# Patient Record
Sex: Female | Born: 1990 | Hispanic: Yes | Marital: Single | Smoking: Never smoker
Health system: Southern US, Community
[De-identification: ages and names within clinical notes are randomized; demographics above are authoritative.]

## PROBLEM LIST (undated history)

## (undated) ENCOUNTER — Inpatient Hospital Stay (HOSPITAL_COMMUNITY): Payer: Self-pay

## (undated) DIAGNOSIS — Z789 Other specified health status: Secondary | ICD-10-CM

---

## 2009-09-30 ENCOUNTER — Inpatient Hospital Stay (HOSPITAL_COMMUNITY): Admission: AD | Admit: 2009-09-30 | Discharge: 2009-10-04 | Payer: Self-pay | Admitting: Obstetrics

## 2009-10-08 ENCOUNTER — Ambulatory Visit: Payer: Self-pay | Admitting: Obstetrics and Gynecology

## 2009-10-08 ENCOUNTER — Inpatient Hospital Stay (HOSPITAL_COMMUNITY): Admission: AD | Admit: 2009-10-08 | Discharge: 2009-10-08 | Payer: Self-pay | Admitting: Obstetrics

## 2010-04-11 LAB — CBC
HCT: 40.1 % (ref 36.0–46.0)
Hemoglobin: 10.9 g/dL — ABNORMAL LOW (ref 12.0–15.0)
Hemoglobin: 13.7 g/dL (ref 12.0–15.0)
MCH: 33.5 pg (ref 26.0–34.0)
MCV: 95.4 fL (ref 78.0–100.0)
MCV: 96.5 fL (ref 78.0–100.0)
Platelets: 195 10*3/uL (ref 150–400)
RBC: 3.24 MIL/uL — ABNORMAL LOW (ref 3.87–5.11)
RBC: 4.2 MIL/uL (ref 3.87–5.11)
WBC: 13.9 10*3/uL — ABNORMAL HIGH (ref 4.0–10.5)
WBC: 9.6 10*3/uL (ref 4.0–10.5)

## 2013-01-13 ENCOUNTER — Inpatient Hospital Stay (HOSPITAL_COMMUNITY): Payer: Self-pay

## 2013-01-13 ENCOUNTER — Encounter (HOSPITAL_COMMUNITY): Payer: Self-pay | Admitting: General Practice

## 2013-01-13 ENCOUNTER — Inpatient Hospital Stay (HOSPITAL_COMMUNITY)
Admission: AD | Admit: 2013-01-13 | Discharge: 2013-01-13 | Disposition: A | Discharge status: Home / Self Care | Payer: Self-pay | Source: Ambulatory Visit | Attending: Obstetrics & Gynecology | Admitting: Obstetrics & Gynecology

## 2013-01-13 DIAGNOSIS — O26859 Spotting complicating pregnancy, unspecified trimester: Secondary | ICD-10-CM | POA: Insufficient documentation

## 2013-01-13 DIAGNOSIS — O209 Hemorrhage in early pregnancy, unspecified: Secondary | ICD-10-CM

## 2013-01-13 LAB — WET PREP, GENITAL: Clue Cells Wet Prep HPF POC: NONE SEEN

## 2013-01-13 LAB — CBC
HCT: 38.3 % (ref 36.0–46.0)
Hemoglobin: 13.5 g/dL (ref 12.0–15.0)
MCV: 87.6 fL (ref 78.0–100.0)
WBC: 9.4 10*3/uL (ref 4.0–10.5)

## 2013-01-13 LAB — URINALYSIS, ROUTINE W REFLEX MICROSCOPIC
Bilirubin Urine: NEGATIVE
Glucose, UA: NEGATIVE mg/dL
Ketones, ur: NEGATIVE mg/dL
Leukocytes, UA: NEGATIVE
Nitrite: NEGATIVE
Specific Gravity, Urine: 1.025 (ref 1.005–1.030)
pH: 6 (ref 5.0–8.0)

## 2013-01-13 LAB — URINE MICROSCOPIC-ADD ON

## 2013-01-13 LAB — HCG, QUANTITATIVE, PREGNANCY: hCG, Beta Chain, Quant, S: 428 m[IU]/mL — ABNORMAL HIGH (ref <5)

## 2013-01-13 NOTE — MAU Note (Signed)
Pt states she had a positive pregnancy test at home a week and a half ago a two more this am all of which are positive but pt states she's had some bleeding today like a period. Pt has no pain but states pressure in left lower quadrant.

## 2013-01-13 NOTE — MAU Note (Signed)
4 +HPT- first was 10 days ago. Spotting started yesterday, more like a period today.

## 2013-01-14 LAB — GC/CHLAMYDIA PROBE AMP: GC Probe RNA: NEGATIVE

## 2013-01-15 ENCOUNTER — Inpatient Hospital Stay (HOSPITAL_COMMUNITY)
Admission: AD | Admit: 2013-01-15 | Discharge: 2013-01-15 | Disposition: A | Discharge status: Home / Self Care | Payer: Self-pay | Source: Ambulatory Visit | Attending: Family Medicine | Admitting: Family Medicine

## 2013-01-15 DIAGNOSIS — O200 Threatened abortion: Secondary | ICD-10-CM | POA: Insufficient documentation

## 2013-01-15 DIAGNOSIS — O209 Hemorrhage in early pregnancy, unspecified: Secondary | ICD-10-CM

## 2013-01-15 LAB — HCG, QUANTITATIVE, PREGNANCY: hCG, Beta Chain, Quant, S: 391 m[IU]/mL — ABNORMAL HIGH (ref <5)

## 2013-01-15 NOTE — MAU Note (Signed)
Pt states via Maday, spanish translator that she is having increased bleeding.since being seen two days ago. No pain except when standing for prolonged time period. Changed pad twice yesterday. Has seen small dime sized clots.

## 2013-01-15 NOTE — MAU Provider Note (Signed)
HPI:  [name] is a 22 y.o. female G2P1001 at [redacted]w[redacted]d who presents for a follow up beta hcg level. She was here 12/18 for vaginal bleeding in pregnancy. Quant on 12/18: 428, Quant today 391. Pt is very tearful about the lab results. She currently denies pain and says her bleeding has decreased.     Objective:  GENERAL: Well-developed, well-nourished female in no acute distress.  HEENT: Normocephalic, atraumatic.   LUNGS: Effort normal HEART: Regular rate  SKIN: Warm, dry and without erythema PSYCH: Normal mood and affect  Filed Vitals:   01/15/13 0956  BP: 113/68  Pulse: 92  Temp: 98.9 F (37.2 C)  TempSrc: Oral  Resp: 18  Height: 5\' 2"  (1.575 m)  Weight: 60.385 kg (133 lb 2 oz)   MDM: Beta Hcg level Pt would like to to wait 48 hours and have another Quant drawn before making any decisions regarding medical management.   A: 1. Threatened miscarriage in early pregnancy   2. Vaginal bleeding in pregnancy, first trimester   3.  Decrease in Beta Hcg level  P: Discharge home is stable condtion Return in 48 hours for beta hcg level Ectopic precautions discussed at length Bleeding precautions discussed Return to MAU as needed, if symptoms worsen   Iona Hansen Rasch, NP 01/15/2013 10:39 AM

## 2013-01-15 NOTE — MAU Provider Note (Signed)
Chart reviewed and agree with management and plan.  

## 2013-01-17 ENCOUNTER — Inpatient Hospital Stay (HOSPITAL_COMMUNITY)
Admission: AD | Admit: 2013-01-17 | Discharge: 2013-01-17 | Disposition: A | Discharge status: Home / Self Care | Payer: Self-pay | Source: Ambulatory Visit | Attending: Obstetrics & Gynecology | Admitting: Obstetrics & Gynecology

## 2013-01-17 ENCOUNTER — Encounter (HOSPITAL_COMMUNITY): Payer: Self-pay

## 2013-01-17 DIAGNOSIS — O039 Complete or unspecified spontaneous abortion without complication: Secondary | ICD-10-CM | POA: Insufficient documentation

## 2013-01-17 LAB — HCG, QUANTITATIVE, PREGNANCY: hCG, Beta Chain, Quant, S: 256 m[IU]/mL — ABNORMAL HIGH (ref <5)

## 2013-01-17 NOTE — MAU Note (Signed)
Patient to MAU for a repeat BHCG. Denies pain but has bleeding less than a period.

## 2013-01-17 NOTE — MAU Provider Note (Signed)
Kathleen Estrada is a 22 y.o. G2P1001 at [redacted]w[redacted]d who presents to MAU today for follow-up quant hCG. The patient was seen for follow-up on 01/15/13 and quant hCG at that time was dropping. The patient was very tearful and desired the pregnancy, so it was agreed that we would have her return again in 48 hours for another quant hCG to confirm. The patient states that she has continued to have some light bleeding and mild lower abdominal pain.   BP 117/71  Pulse 97  Temp(Src) 99.1 F (37.3 C) (Oral)  Resp 16  SpO2 100%  LMP 12/06/2012  Breastfeeding? Unknown GENERAL: Well-developed, well-nourished female in no acute distress.  HEENT: Normocephalic, atraumatic.   LUNGS: Effort normal HEART: Regular rate  SKIN: Warm, dry and without erythema PSYCH: Normal mood and affect  Results for [name], [name] (MRN [id number]) as of 01/17/2013 16:36  Ref. Range 01/13/2013 12:13 01/13/2013 14:16 01/13/2013 14:23 01/15/2013 09:47 01/17/2013 09:45  hCG, Beta Chain, Quant, S Latest Range: <5 mIU/mL 428 (H)   391 (H) 256 (H)    A: SAB  P: Discharge home Bleeding precautions discussed Patient referred to [facility] clinic for follow-up in ~ 2 weeks Patient may return to MAU as needed or if her condition were to change or worsen  Freddi Starr, PA-C 01/17/2013 4:38 PM

## 2013-01-31 ENCOUNTER — Encounter: Payer: Self-pay | Admitting: Nurse Practitioner

## 2013-01-31 ENCOUNTER — Ambulatory Visit (INDEPENDENT_AMBULATORY_CARE_PROVIDER_SITE_OTHER): Payer: Self-pay | Admitting: Obstetrics & Gynecology

## 2013-01-31 VITALS — BP 112/72 | HR 90 | Ht 62.6 in | Wt 133.5 lb

## 2013-01-31 DIAGNOSIS — Z309 Encounter for contraceptive management, unspecified: Secondary | ICD-10-CM

## 2013-01-31 DIAGNOSIS — O039 Complete or unspecified spontaneous abortion without complication: Secondary | ICD-10-CM

## 2013-01-31 NOTE — Progress Notes (Signed)
Has some pelvic pain when standing. No more bleeding.

## 2013-01-31 NOTE — Patient Instructions (Signed)
Aborto espontáneo  °(Miscarriage) °El aborto espontáneo es la pérdida de un bebé que no ha nacido (feto) antes de la semana 20 del embarazo. La mayor parte de estos abortos ocurre en los primeros 3 meses. En algunos casos ocurre antes de que la mujer sepa que está embarazada. También se denomina "aborto espontáneo" o "pérdida prematura del embarazo". El aborto espontáneo puede ser una experiencia que afecte emocionalmente a la persona. Converse con su médico si tiene dudas, cómo es el proceso de duelo, y sobre planes futuros de embarazo.  °CAUSAS  °· Algunos problemas cromosómicos pueden hacer imposible que el bebé se desarrolle normalmente. Los problemas con los genes o cromosomas del bebé son generalmente el resultado de errores que se producen, por casualidad, cuando el embrión se divide y crece. Estos problemas no se heredan de los padres. °· Infección en el cuello del útero.   °· Problemas hormonales.   °· Problemas en el cuello del útero, como tener un útero incompetente. Esto ocurre cuando los tejidos no son lo suficientemente fuertes como para contener el embarazo.   °· Problemas del útero, como un útero con forma anormal, los fibromas o anormalidades congénitas.   °· Ciertas enfermedades crónicas.   °· No fume, no beba alcohol, ni consuma drogas.   °· Traumatismos   °A veces, la causa es desconocida.  °SÍNTOMAS  °· Sangrado o manchado vaginal, con o sin cólicos o dolor. °· Dolor o cólicos en el abdomen o en la cintura. °· Eliminación de líquido, tejidos o coágulos grandes por la vagina. °DIAGNÓSTICO  °El médico le hará un examen físico. También le indicará una ecografía para confirmar el aborto. Es posible que se realicen análisis de sangre.  °TRATAMIENTO  °· En algunos casos el tratamiento no es necesario, si se eliminan naturalmente todos los tejidos embrionarios que se encontraban en el útero. Si el feto o la placenta quedan dentro del útero (aborto incompleto), pueden infectarse, los tejidos que quedan  pueden infectarse y deben retirarse. Generalmente se realiza un procedimiento de dilatación y curetaje (D y C). Durante el procedimiento de dilatación y curetaje, el cuello del útero se abre (dilata) y se retira cualquier resto de tejido fetal o placentario del útero. °· Si hay una infección, le recetarán antibióticos. Podrán recetarle otros medicamentos para reducir el tamaño del útero (contraerlo) si hay una mucho sangrado. °· Si su sangre es Rh negativa y su bebé es Rh positivo, usted necesitará la inyección de inmunoglobulina Rh. Esta inyección protegerá a los futuros bebés de tener problemas de compatibilidad Rh en futuros embarazos. °INSTRUCCIONES PARA EL CUIDADO EN EL HOGAR  °· El médico le indicará reposo en cama o le permitirá realizar actividades livianas. Vuelva a la actividad lentamente o según las indicaciones de su médico. °· Pídale a alguien que la ayude con las responsabilidades familiares y del hogar durante este tiempo.   °· Lleve un registro de la cantidad y la saturación de las toallas higiénicas que utiliza cada día. Anote esta información   °· No use tampones. No No se haga duchas vaginales ni tenga relaciones sexuales hasta que el médico la autorice.   °· Sólo tome medicamentos de venta libre o recetados para calmar el dolor o el malestar, según las indicaciones de su médico.   °· No tome aspirina. La aspirina puede ocasionar hemorragias.   °· Concurra puntualmente a las citas de control con el médico.   °· Si usted o su pareja tienen dificultades con el duelo, hable con su médico para buscar la ayuda psicológica que los ayude a enfrentar la pérdida   del embarazo. Permítase el tiempo suficiente de duelo antes de quedar embarazada nuevamente.   °SOLICITE ATENCIÓN MÉDICA DE INMEDIATO SI:  °· Siente calambres intensos o dolor en la espalda o en el abdomen. °· Tiene fiebre. °· Elimina grandes coágulos de sangre (del tamaño de una nuez o más) o tejidos por la vagina. Guarde lo que ha eliminado para  que su médico lo examine.   °· La hemorragia aumenta.   °· Observa una secreción vaginal espesa y con mal olor. °· Se siente mareada, débil, o se desmaya.   °· Siente escalofríos.   °ASEGÚRESE DE QUE:  °· Comprende estas instrucciones. °· Controlará su enfermedad. °· Solicitará ayuda de inmediato si no mejora o si empeora. °Document Released: 10/23/2004 Document Revised: 05/10/2012 °ExitCare® Patient Information ©2014 [employer]. ° °

## 2013-02-02 NOTE — Progress Notes (Signed)
Subjective:     Patient ID: Kathleen Estrada, female   DOB: [birth year], 23 y.o.   MRN: [id number]  HPI Pt s/p SAB.  She had documented falling bHCG levels.  She denies current bleeding or problems.  Th epregnancy was planned but, she is now interested in waiting for 1 years.  She was previously on OCP's from the HD and she wants to continue those.   Review of Systems     Objective:   Physical Exam BP 112/72  Pulse 90  Ht 5' 2.6" (1.59 m)  Wt 133 lb 8 oz (60.555 kg)  BMI 23.95 kg/m2  LMP 12/06/2012 Abd: soft NT, ND     Assessment:     S/p SAB doing well Contraception counseling     Plan:     Cont OCP (pt thinks that it is [facility])  F/u 1 year or sooner prn

## 2013-11-28 ENCOUNTER — Encounter: Payer: Self-pay | Admitting: Nurse Practitioner

## 2014-08-31 ENCOUNTER — Other Ambulatory Visit: Payer: Self-pay | Admitting: Podiatry

## 2014-08-31 MED ORDER — CICLOPIROX 8 % EX SOLN: Freq: Every day | CUTANEOUS | Status: DC

## 2015-07-17 ENCOUNTER — Ambulatory Visit (INDEPENDENT_AMBULATORY_CARE_PROVIDER_SITE_OTHER): Payer: Self-pay

## 2015-07-17 ENCOUNTER — Ambulatory Visit (INDEPENDENT_AMBULATORY_CARE_PROVIDER_SITE_OTHER): Payer: Self-pay | Admitting: Internal Medicine

## 2015-07-17 VITALS — BP 122/80 | HR 73 | Temp 99.3°F | Resp 16 | Ht 63.0 in | Wt 134.6 lb

## 2015-07-17 DIAGNOSIS — S52121A Displaced fracture of head of right radius, initial encounter for closed fracture: Secondary | ICD-10-CM

## 2015-07-17 DIAGNOSIS — M25521 Pain in right elbow: Secondary | ICD-10-CM

## 2015-07-17 NOTE — Progress Notes (Signed)
Subjective:  By signing my name below, I, Raven Small, attest that this documentation has been prepared under the direction and in the presence of Ellamae Siaobert Willet Schleifer, MD.  Electronically Signed: Andrew Auaven Small, ED Scribe. 07/17/2015. 12:45 PM.   Patient ID: Kathleen Estrada, female    DOB: Jul 19, 1990, 25 y.o.   MRN: [id number]  HPI Chief Complaint  Patient presents with  . Fall  . Arm Injury    states she fell on yesterday and hurt her right arm;   . Hand Pain    right hand pain from fall    HPI Comments: Kathleen Estrada is a 25 y.o. female who presents to the [facility] complaining of a right arm injury that occurred yesterday. Pt FOOSH and injured right arm. She has pain with movement and straighten of right arm and elbow. Pt denies chance of pregnancy.   Fell over "bumper" in parking lot at [employer]   There are no active problems to display for this patient.  History reviewed. No pertinent past medical history. Past Surgical History  Procedure Laterality Date  . Cesarean section  2011   No Known Allergies Prior to Admission medications   Not on File   Social History   Social History  . Marital Status: Single    Spouse Name: N/A  . Number of Children: N/A  . Years of Education: N/A   Occupational History  . Not on file.   Social History Main Topics  . Smoking status: Never Smoker   . Smokeless tobacco: Not on file  . Alcohol Use: No  . Drug Use: No  . Sexual Activity: Not on file   Other Topics Concern  . Not on file   Social History Narrative   Review of Systems     Objective:   Physical Exam  Constitutional: She is oriented to person, place, and time. She appears well-developed and well-nourished. No distress.  HENT:  Head: Normocephalic and atraumatic.  Eyes: Conjunctivae and EOM are normal.  Neck: Neck supple.  Cardiovascular: Normal rate.   Pulmonary/Chest: Effort normal.  Musculoskeletal: Normal range of motion.    The right elbow is swollen mildly with discomfort on palpation laterally and with pain on supination and pronation and extension. She lacks full extension. Wrist and shoulder are normal.   Neurovascular intact R arm,hand  Neurological: She is alert and oriented to person, place, and time.  Skin: Skin is warm and dry.  Psychiatric: She has a normal mood and affect. Her behavior is normal.  Nursing note and vitals reviewed.   Filed Vitals:   07/17/15 1218  BP: 122/80  Pulse: 73  Temp: 99.3 F (37.4 C)  TempSrc: Oral  Resp: 16  Height: 5\' 3"  (1.6 m)  Weight: 134 lb 9.6 oz (61.054 kg)  SpO2: 99%    Dg Elbow Complete Right (3+view)  07/17/2015  CLINICAL DATA:  Right elbow injury from fall yesterday EXAM: RIGHT ELBOW - COMPLETE 3+ VIEW COMPARISON:  None. FINDINGS: There is a right elbow joint effusion. There is mild cortical irregularity noted along the anterior radial head on the lateral view only. This may reflect subtle nondisplaced fracture. IMPRESSION: Right elbow joint effusion. Concern for subtle nondisplaced radial head fracture seen only on the lateral view. Consider immobilization and repeat imaging in 1 week if symptoms persist. Electronically Signed   By: Charlett NoseKevin  Dover M.D.   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

## 2015-07-17 NOTE — Progress Notes (Signed)
Proceudre:  Long arm posterior splint applied.

## 2015-07-17 NOTE — Patient Instructions (Signed)
     IF you received an x-ray today, you will receive an invoice from [facility]. Please contact [facility] at [phone] with questions or concerns regarding your invoice.   IF you received labwork today, you will receive an invoice from [facility]. Please contact [facility] at [phone] with questions or concerns regarding your invoice.   Our billing staff will not be able to assist you with questions regarding bills from these companies.  You will be contacted with the lab results as soon as they are available. The fastest way to get your results is to activate your My Chart account. Instructions are located on the last page of this paperwork. If you have not heard from us regarding the results in 2 weeks, please contact this office.      

## 2018-01-10 ENCOUNTER — Encounter (HOSPITAL_COMMUNITY): Payer: Self-pay

## 2018-01-10 ENCOUNTER — Inpatient Hospital Stay (HOSPITAL_COMMUNITY)
Admission: AD | Admit: 2018-01-10 | Discharge: 2018-01-10 | Disposition: A | Discharge status: Home / Self Care | Payer: Self-pay | Source: Ambulatory Visit | Attending: Obstetrics & Gynecology | Admitting: Obstetrics & Gynecology

## 2018-01-10 ENCOUNTER — Inpatient Hospital Stay (HOSPITAL_COMMUNITY): Payer: Self-pay

## 2018-01-10 DIAGNOSIS — O208 Other hemorrhage in early pregnancy: Secondary | ICD-10-CM

## 2018-01-10 DIAGNOSIS — O34219 Maternal care for unspecified type scar from previous cesarean delivery: Secondary | ICD-10-CM | POA: Insufficient documentation

## 2018-01-10 DIAGNOSIS — Z3A01 Less than 8 weeks gestation of pregnancy: Secondary | ICD-10-CM | POA: Insufficient documentation

## 2018-01-10 DIAGNOSIS — Z3491 Encounter for supervision of normal pregnancy, unspecified, first trimester: Secondary | ICD-10-CM

## 2018-01-10 DIAGNOSIS — O26891 Other specified pregnancy related conditions, first trimester: Secondary | ICD-10-CM

## 2018-01-10 DIAGNOSIS — O209 Hemorrhage in early pregnancy, unspecified: Secondary | ICD-10-CM | POA: Insufficient documentation

## 2018-01-10 HISTORY — DX: Other specified health status: Z78.9

## 2018-01-10 LAB — URINALYSIS, ROUTINE W REFLEX MICROSCOPIC
BILIRUBIN URINE: NEGATIVE
Glucose, UA: NEGATIVE mg/dL
Ketones, ur: NEGATIVE mg/dL
Leukocytes, UA: NEGATIVE
Nitrite: NEGATIVE
PH: 7 (ref 5.0–8.0)
Protein, ur: NEGATIVE mg/dL
Specific Gravity, Urine: 1.02 (ref 1.005–1.030)

## 2018-01-10 LAB — URINALYSIS, MICROSCOPIC (REFLEX)
Bacteria, UA: NONE SEEN
WBC, UA: NONE SEEN WBC/hpf (ref 0–5)

## 2018-01-10 LAB — CBC
HCT: 41.5 % (ref 36.0–46.0)
Hemoglobin: 13.9 g/dL (ref 12.0–15.0)
MCH: 31.4 pg (ref 26.0–34.0)
MCHC: 33.5 g/dL (ref 30.0–36.0)
MCV: 93.7 fL (ref 80.0–100.0)
Platelets: 312 10*3/uL (ref 150–400)
RBC: 4.43 MIL/uL (ref 3.87–5.11)
RDW: 12.5 % (ref 11.5–15.5)
WBC: 7.7 10*3/uL (ref 4.0–10.5)
nRBC: 0 % (ref 0.0–0.2)

## 2018-01-10 LAB — POCT PREGNANCY, URINE: Preg Test, Ur: POSITIVE — AB

## 2018-01-10 LAB — HCG, QUANTITATIVE, PREGNANCY: hCG, Beta Chain, Quant, S: 32517 m[IU]/mL — ABNORMAL HIGH (ref <5)

## 2018-01-10 NOTE — MAU Provider Note (Signed)
  History     CSN: [id number]  Arrival date and time: 01/10/18 1107   First Provider Initiated Contact with Patient 01/10/18 1201      Chief Complaint  Patient presents with  . Vaginal Bleeding   Mrs. Sherren MochaCortes-Rivera is a 27 y/o G3P1011 with no contributory medical history who presents to the MAU today with concern for 24 hr history of bloody vaginal discharge. She states that she first noticed the discharge last night when she went to the bathroom. Today she feels like it has gotten worse. She states that the discharge started abruptly and there is no associated pain or discomfort. She has never had any symptoms like this before.   She denies associated fever, chills, nausea, diarrhea. She is not treated for any chronic medical conditions and does not have any allergies. She take a prenatal vitamin and [facility].   Vaginal Bleeding  The patient's primary symptoms include vaginal discharge. Pertinent negatives include no chills or fever.    OB History    Gravida  3   Para  1   Term  1   Preterm      AB  1   Living  1     SAB  1   TAB      Ectopic      Multiple      Live Births  1           Past Medical History:  Diagnosis Date  . Medical history non-contributory     Past Surgical History:  Procedure Laterality Date  . CESAREAN SECTION  2011    History reviewed. No pertinent family history.  Social History   Tobacco Use  . Smoking status: Never Smoker  . Smokeless tobacco: Never Used  Substance Use Topics  . Alcohol use: No  . Drug use: No    Allergies: No Known Allergies  No medications prior to admission.    Review of Systems  Constitutional: Negative for chills and fever.  HENT: Negative.   Eyes: Negative.   Respiratory: Negative.   Cardiovascular: Negative.   Gastrointestinal: Negative.   Genitourinary: Positive for vaginal bleeding and vaginal discharge. Negative for vaginal pain.   Musculoskeletal: Negative.    Physical Exam   Blood pressure 115/68, pulse 93, temperature 99 F (37.2 C), temperature source Oral, resp. rate 18, height 5\' 2"  (1.575 m), weight 65.4 kg, last menstrual period 11/13/2017.  Physical Exam  MAU Course  Procedures  MDM Mrs. Cortes-Rivera's initial presentation is concerning for ectopic pregnancy. An US and labs were ordered. The US revealed an intrauterine preganncy which was concerning but not diagnostic for nonviablity. Lab results were unremarkable. Given the patient's stability and lack of acute concerns, the decision was made to d/c with instructions to f/u as necessary.   Assessment and Plan   1. Normal intrauterine pregnancy on prenatal ultrasound in first trimester   2. Vaginal bleeding affecting early pregnancy    Patient educated on return precautions:  Return to MAU if bleeding continues or worsens   Follow up with OB/GYN for follow on prenatal care.   Salomon FickJohn A Leigh Kaeding 01/10/2018, 12:23 PM

## 2018-01-10 NOTE — MAU Provider Note (Signed)
History     CSN: [id number]  Arrival date and time: 01/10/18 1107   First Provider Initiated Contact with Patient 01/10/18 1201      Chief Complaint  Patient presents with  . Vaginal Bleeding   HPI [name] is a 27 y/o G3P1011 with no contributory medical history who presents to the MAU today with concern for 24 hr history of bloody vaginal discharge. She states that she first noticed the discharge last night when she went to the bathroom. Today she feels like it has gotten worse. She states that the discharge started abruptly and there is no associated pain or discomfort. She has never had any symptoms like this before.   She denies associated fever, chills, nausea, diarrhea. She is not treated for any chronic medical conditions and does not have any allergies. She take a prenatal vitamin and receives prenatal care at the [facility] department.   OB History    Gravida  3   Para  1   Term  1   Preterm      AB  1   Living  1     SAB  1   TAB      Ectopic      Multiple      Live Births  1           Past Medical History:  Diagnosis Date  . Medical history non-contributory     Past Surgical History:  Procedure Laterality Date  . CESAREAN SECTION  2011    History reviewed. No pertinent family history.  Social History   Tobacco Use  . Smoking status: Never Smoker  . Smokeless tobacco: Never Used  Substance Use Topics  . Alcohol use: No  . Drug use: No    Allergies: No Known Allergies  No medications prior to admission.    Review of Systems  Constitutional: Negative.  Negative for fatigue and fever.  HENT: Negative.   Respiratory: Negative.  Negative for shortness of breath.   Cardiovascular: Negative.  Negative for chest pain.  Gastrointestinal: Negative.  Negative for abdominal pain, constipation, diarrhea, nausea and vomiting.  Genitourinary: Positive for vaginal bleeding. Negative for dysuria.  Neurological: Negative.  Negative  for dizziness and headaches.   Physical Exam   Blood pressure 115/68, pulse 93, temperature 99 F (37.2 C), temperature source Oral, resp. rate 18, height 5\' 2"  (1.575 m), weight 65.4 kg, last menstrual period 11/13/2017.  Physical Exam  Nursing note and vitals reviewed. Constitutional: She is oriented to person, place, and time. She appears well-developed and well-nourished. No distress.  HENT:  Head: Normocephalic.  Eyes: Pupils are equal, round, and reactive to light.  Cardiovascular: Normal rate, regular rhythm and normal heart sounds.  Respiratory: Effort normal and breath sounds normal. No respiratory distress.  GI: Soft. Bowel sounds are normal. She exhibits no distension. There is no abdominal tenderness.  Genitourinary:    Genitourinary Comments: Scant amount of bright red blood   Neurological: She is alert and oriented to person, place, and time.  Skin: Skin is warm and dry.  Psychiatric: She has a normal mood and affect. Her behavior is normal. Judgment and thought content normal.   MAU Course  Procedures Results for orders placed or performed during the hospital encounter of 01/10/18 (from the past 24 hour(s))  Urinalysis, Routine w reflex microscopic     Status: Abnormal   Collection Time: 01/10/18 11:45 AM  Result Value Ref Range   Color, Urine YELLOW YELLOW  APPearance HAZY (A) CLEAR   Specific Gravity, Urine 1.020 1.005 - 1.030   pH 7.0 5.0 - 8.0   Glucose, UA NEGATIVE NEGATIVE mg/dL   Hgb urine dipstick LARGE (A) NEGATIVE   Bilirubin Urine NEGATIVE NEGATIVE   Ketones, ur NEGATIVE NEGATIVE mg/dL   Protein, ur NEGATIVE NEGATIVE mg/dL   Nitrite NEGATIVE NEGATIVE   Leukocytes, UA NEGATIVE NEGATIVE  Urinalysis, Microscopic (reflex)     Status: None   Collection Time: 01/10/18 11:45 AM  Result Value Ref Range   RBC / HPF 0-5 0 - 5 RBC/hpf   WBC, UA NONE SEEN 0 - 5 WBC/hpf   Bacteria, UA NONE SEEN NONE SEEN   Squamous Epithelial / LPF 0-5 0 - 5   Amorphous  Crystal PRESENT   Pregnancy, urine POC     Status: Abnormal   Collection Time: 01/10/18 11:48 AM  Result Value Ref Range   Preg Test, Ur POSITIVE (A) NEGATIVE  CBC     Status: None   Collection Time: 01/10/18 12:22 PM  Result Value Ref Range   WBC 7.7 4.0 - 10.5 K/uL   RBC 4.43 3.87 - 5.11 MIL/uL   Hemoglobin 13.9 12.0 - 15.0 g/dL   HCT 16.141.5 09.636.0 - 04.546.0 %   MCV 93.7 80.0 - 100.0 fL   MCH 31.4 26.0 - 34.0 pg   MCHC 33.5 30.0 - 36.0 g/dL   RDW 40.912.5 81.111.5 - 91.415.5 %   Platelets 312 150 - 400 K/uL   nRBC 0.0 0.0 - 0.2 %  hCG, quantitative, pregnancy     Status: Abnormal   Collection Time: 01/10/18 12:22 PM  Result Value Ref Range   hCG, Beta Chain, Quant, S 32,517 (H) <5 mIU/mL   [place] Ob Less Than 14 Weeks With Ob Transvaginal  Result Date: 01/10/2018 CLINICAL DATA:  Vaginal bleeding.  Early pregnancy. EXAM: OBSTETRIC <14 WK US AND TRANSVAGINAL OB US TECHNIQUE: Both transabdominal and transvaginal ultrasound examinations were performed for complete evaluation of the gestation as well as the maternal uterus, adnexal regions, and pelvic cul-de-sac. Transvaginal technique was performed to assess early pregnancy. COMPARISON:  None. FINDINGS: Intrauterine gestational sac: Single Yolk sac:  Visualized. Embryo:  Visualized. Cardiac Activity: Not Visualized. MSD: 25.8 mm   7 w   4 d CRL:  1.4 mm.  Too small to estimate age. Subchorionic hemorrhage:  None visualized. Maternal uterus/adnexae: Normal. IMPRESSION: 1. An intrauterine pregnancy is identified with a gestational sac and yolk sac. A small fetal pole is identified as well. The lack of cardiac activity given a fetal pole is suspicious but not diagnostic of non viability. Findings are suspicious but not yet definitive for failed pregnancy. Recommend follow-up US in 10-14 days for definitive diagnosis. This recommendation follows SRU consensus guidelines: Diagnostic Criteria for Nonviable Pregnancy Early in the First Trimester. Malva Limes Engl J Med 2013;  782:9562-13;  369:1443-51. Electronically Signed   By: Gerome Samavid  Williams III M.D   On: 01/10/2018 14:29   MDM UA, UPT CBC, HCG ABO/Rh- O Pos Wet prep and gc/chlamydia US OB Comp Less 14 weeks with Transvaginal  Assessment and Plan   1. Normal intrauterine pregnancy on prenatal ultrasound in first trimester   2. Vaginal bleeding affecting early pregnancy    -Discharge home in stable condition -Vaginal bleeding and pain precautions discussed -Patient advised to follow-up with OB of choice for prenatal care. Order placed for outpatient u/s in approximately 10 days to confirm viability. -Patient may return to MAU as needed or if her condition  were to change or worsen  Rolm Bookbinder CNM 01/10/2018, 12:01 PM

## 2018-01-10 NOTE — MAU Note (Signed)
Discharge with pink blood 3 days ago and each day since it has gotten heavier. Notices blood in the bathroom when she wipes, not having to wear a pad  No pain  LMP 11/13/17

## 2018-01-10 NOTE — Discharge Instructions (Signed)

## 2018-01-15 ENCOUNTER — Ambulatory Visit (INDEPENDENT_AMBULATORY_CARE_PROVIDER_SITE_OTHER): Payer: Self-pay

## 2018-01-15 ENCOUNTER — Ambulatory Visit (HOSPITAL_COMMUNITY)
Admission: RE | Admit: 2018-01-15 | Discharge: 2018-01-15 | Disposition: A | Discharge status: Home / Self Care | Payer: Self-pay | Source: Ambulatory Visit

## 2018-01-15 DIAGNOSIS — O208 Other hemorrhage in early pregnancy: Secondary | ICD-10-CM

## 2018-01-15 DIAGNOSIS — O3680X0 Pregnancy with inconclusive fetal viability, not applicable or unspecified: Secondary | ICD-10-CM

## 2018-01-15 DIAGNOSIS — O209 Hemorrhage in early pregnancy, unspecified: Secondary | ICD-10-CM | POA: Insufficient documentation

## 2018-01-15 DIAGNOSIS — Z3A01 Less than 8 weeks gestation of pregnancy: Secondary | ICD-10-CM | POA: Insufficient documentation

## 2018-01-15 NOTE — Progress Notes (Signed)
Chart reviewed - agree with RN documentation.   

## 2018-01-15 NOTE — Progress Notes (Signed)
Pt here today for OB US results.  With Spanish Interpreter Raquel Carrolyn MeiersMora pt reports that she is having scant red vaginal bleeding that she has to change a pad a day.  Notified Dr. Adrian BlackwaterStinson pt US results.  Provider recommendation to have US in two weeks.   US scheduled for 01/29/17  @ 0800.  Pt notified. I advised pt that if her bleeding increases or starts to have pain to please go to MAU.  Pt stated understanding.

## 2018-01-24 ENCOUNTER — Inpatient Hospital Stay (HOSPITAL_COMMUNITY)
Admission: AD | Admit: 2018-01-24 | Discharge: 2018-01-24 | Disposition: A | Discharge status: Home / Self Care | Payer: Self-pay | Attending: Obstetrics and Gynecology | Admitting: Obstetrics and Gynecology

## 2018-01-24 ENCOUNTER — Encounter (HOSPITAL_COMMUNITY): Payer: Self-pay

## 2018-01-24 ENCOUNTER — Other Ambulatory Visit: Payer: Self-pay

## 2018-01-24 ENCOUNTER — Inpatient Hospital Stay (HOSPITAL_COMMUNITY): Payer: Self-pay

## 2018-01-24 DIAGNOSIS — O209 Hemorrhage in early pregnancy, unspecified: Secondary | ICD-10-CM

## 2018-01-24 DIAGNOSIS — O039 Complete or unspecified spontaneous abortion without complication: Secondary | ICD-10-CM | POA: Insufficient documentation

## 2018-01-24 LAB — CBC
HCT: 39 % (ref 36.0–46.0)
Hemoglobin: 13.1 g/dL (ref 12.0–15.0)
MCH: 30.9 pg (ref 26.0–34.0)
MCHC: 33.6 g/dL (ref 30.0–36.0)
MCV: 92 fL (ref 80.0–100.0)
Platelets: 270 10*3/uL (ref 150–400)
RBC: 4.24 MIL/uL (ref 3.87–5.11)
RDW: 12.4 % (ref 11.5–15.5)
WBC: 7.8 10*3/uL (ref 4.0–10.5)
nRBC: 0 % (ref 0.0–0.2)

## 2018-01-24 MED ORDER — OXYCODONE-ACETAMINOPHEN 5-325 MG PO TABS
2.0000 | ORAL_TABLET | Freq: Once | ORAL | Status: AC
  Administered 2018-01-24: 2 via ORAL
  Filled 2018-01-24: qty 2

## 2018-01-24 MED ORDER — OXYCODONE-ACETAMINOPHEN 5-325 MG PO TABS: 1.0000 | ORAL_TABLET | ORAL | 0 refills | Status: AC | PRN

## 2018-01-24 NOTE — Discharge Instructions (Signed)
Aborto espontáneo  Miscarriage  El aborto espontáneo es la pérdida de un bebé que no ha nacido (feto) antes de la semana 20 del embarazo. La mayor parte de los abortos espontáneos ocurre durante los primeros 3 meses de embarazo. A veces, un aborto ocurre antes de que la mujer sepa que está embarazada.  El aborto espontáneo puede ser una experiencia que afecte emocionalmente a la persona. Si ha sufrido un aborto espontáneo, hable con su médico y hágale las preguntas que tenga sobre el aborto espontáneo, el proceso de duelo y los planes futuros de embarazo.  ¿Cuáles son las causas?  Entre las causas de un aborto espontáneo se incluyen las siguientes:  · Problemas genéticos o cromosómicos del feto. Estos problemas impiden que el bebé se desarrolle con normalidad. En general, son el resultado de errores fortuitos que ocurren en la etapa temprana del desarrollo y que no se transmiten de padres a hijos (no se heredan).  · Infección en el cuello del útero.  · Trastornos que afectan el equilibrio hormonal del organismo.  · Problemas en el cuello del útero, como su adelgazamiento y apertura antes de que el embarazo llegue a término (insuficiencia del cuello de útero).  · Problemas en el útero. Estos pueden incluir, entre otros, los siguientes:  ? Forma anormal del útero.  ? Fibromas en el útero.  ? Anormalidades congénitas. Estos son problemas que ya estaban presentes en el nacimiento.  · Ciertas enfermedades crónicas.  · Fumar, beber alcohol o usar drogas.  · Lesiones (traumatismos).  En muchos de los casos, se desconoce la causa de los abortos espontáneos.  ¿Cuáles son los signos o los síntomas?  Los síntomas de esta afección incluyen los siguientes:  · Sangrado o manchado vaginal, con o sin cólicos o dolor.  · Dolor o cólicos en el abdomen o en la parte inferior de la espalda.  · Eliminación de líquido, tejidos o coágulos sanguíneos por la vagina.  ¿Cómo se diagnostica?  Esta afección se puede diagnosticar en función de  lo siguiente:  · Examen físico.  · Ecografía.  · Análisis de sangre.  · Análisis de orina.  ¿Cómo se trata?  En algunos casos, el tratamiento de un aborto espontáneo no es necesario si se eliminan de forma natural todos los tejidos que se encontraban en el útero. Si fuera necesario realizar un tratamiento por esta afección, este puede incluir lo siguiente:  · Dilatación y curetaje (D&C). Mediante este procedimiento, se expande el cuello del útero y se raspan las paredes (endometrio). Esto se realiza solamente si queda tejido del feto o la placenta dentro del cuerpo (aborto espontáneo incompleto).  · Medicamentos, por ejemplo:  ? Antibióticos para tratar una infección.  ? Medicamentos para ayudar al cuerpo a eliminar los restos de tejido.  ? Medicamentos para reducir (contraer) el tamaño del útero. Estos medicamentos se pueden administrar si tiene un sangrado abundante.  Si su factor sanguíneo es Rh negativo y el de su bebé es Rh positivo, usted necesitará una inyección del medicamento llamado inmunoglobulina Rhpara proteger a los bebés futuros de tener problemas con el factor sanguíneo Rh. Los términos "Rh negativo" y "Rh positivo" hacen referencia a la presencia o no en la sangre de una proteína específica que se encuentra en la superficie de los glóbulos rojos (factor Rh).  Siga estas indicaciones en su casa:  Medicamentos    · Tome los medicamentos de venta libre y los recetados solamente como se lo haya indicado el médico.  · Si le recetaron antibióticos, tómelos como se lo haya indicado   el médico. No deje de tomar los antibióticos aunque comience a sentirse mejor.  · No tome antiinflamatorios no esteroideos ([name]), tales como aspirina e ibuprofeno, a menos que se lo indique el médico. Estos medicamentos pueden provocarle sangrado.  Actividad  · Haga reposo según lo indicado. Pregúntele al médico qué actividades son seguras para usted.  · Pídale a alguien que la ayude con las responsabilidades familiares y del  hogar durante este tiempo.  Instrucciones generales  · Lleve un registro de la cantidad y la saturación de las toallas higiénicas que utiliza cada día. Anote esta información.  · Anote la cantidad de tejido o coágulos sanguíneos que expulsa por la vagina. Guarde las cantidades grandes de tejidos para que el médico los examine.  · No use tampones, no se haga duchas vaginales ni tenga relaciones sexuales hasta que el médico la autorice.  · Para que usted y su pareja puedan sobrellevar el proceso del duelo, hable con su médico o busque apoyo psicológico.  · Cuando esté lista, visite a su médico para hablar sobre los pasos importantes que deberá seguir en relación con su salud. También hable sobre las medidas que deberá tomar para tener un embarazo saludable en el futuro.  · Concurra a todas las visitas de seguimiento como se lo haya indicado el médico. Esto es importante.  Dónde encontrar más información  · Colegio [place] de Obstetras y Ginecólogos (American College of Obstetricians and Gynecologists): www.acog.org  · Departamento de Salud y Servicios Humanos de los [place], Oficina de Salud de la Mujer (U.S. Department of Health and Human Services, Office on Women’s Health): www.womenshealth.gov  Comuníquese con un médico si:  · Tiene fiebre o siente escalofríos.  · Tiene una secreción vaginal con mal olor.  · El sangrado aumenta en vez de disminuir.  Solicite ayuda de inmediato si:  · Siente calambres intensos o dolor en la espalda o en el abdomen.  · Elimina coágulos de sangre o tejido por la vagina del tamaño de una nuez o más grandes.  · Necesita más de una toalla higiénica de tamaño regular por hora.  · Se siente mareada o débil.  · Se desmaya.  · Siente una tristeza que la invade o piensa en lastimarse.  Resumen  · La mayor parte de los abortos espontáneos ocurre durante los primeros 3 meses de embarazo. En algunos casos, el aborto espontáneo ocurre antes de que la mujer sepa que está  embarazada.  · Siga las indicaciones del médico para el cuidado en el hogar. Concurra a todas las visitas de control.  · Para que usted y su pareja puedan sobrellevar el proceso del duelo, hable con su médico o busque apoyo psicológico.  Esta información no tiene como fin reemplazar el consejo del médico. Asegúrese de hacerle al médico cualquier pregunta que tenga.  Document Released: 10/23/2004 Document Revised: 10/20/2016 Document Reviewed: 10/20/2016  Elsevier Interactive Patient Education © 2019 Elsevier Inc.

## 2018-01-24 NOTE — MAU Provider Note (Signed)
Chief Complaint: Abdominal Pain; Vaginal Bleeding; and Back Pain   First Provider Initiated Contact with Patient 01/24/18 0858     SUBJECTIVE HPI: Kathleen Estrada is a 27 y.o. G3P1011 at 7356w2d who presents to Maternity Admissions reporting vaginal bleeding and abdominal pain. Had ultrasound last week that shows 19 mm IUGS & no embryo.  States she has been bleeding for the last 2 weeks but became heavy today. Has passed several palm sized clots & bled through 2 pads this morning.   Spanish interpreter used for this visit.   Location: lower abdomen Quality: cramping Severity: 5/10 on pain scale Duration: 1 day Timing: intermittent Modifying factors: none Associated signs and symptoms: vaginal bleeding  Past Medical History:  Diagnosis Date  . Medical history non-contributory    OB History  Gravida Para Term Preterm AB Living  3 1 1   1 1   SAB TAB Ectopic Multiple Live Births  1       1    # Outcome Date GA Lbr Len/2nd Weight Sex Delivery Anes PTL Lv  3 Current           2 Term 10/01/09    M CS-LTranv  Y LIV     Birth Comments: footling breech  1 SAB              Birth Comments: System Generated. Please review and update pregnancy details.   Past Surgical History:  Procedure Laterality Date  . CESAREAN SECTION  2011   Social History   Socioeconomic History  . Marital status: Married    Spouse name: Not on file  . Number of children: Not on file  . Years of education: Not on file  . Highest education level: Not on file  Occupational History  . Not on file  Social Needs  . Financial resource strain: Not on file  . Food insecurity:    Worry: Not on file    Inability: Not on file  . Transportation needs:    Medical: Not on file    Non-medical: Not on file  Tobacco Use  . Smoking status: Never Smoker  . Smokeless tobacco: Never Used  Substance and Sexual Activity  . Alcohol use: No  . Drug use: No  . Sexual activity: Not Currently    Birth  control/protection: None  Lifestyle  . Physical activity:    Days per week: Not on file    Minutes per session: Not on file  . Stress: Not on file  Relationships  . Social connections:    Talks on phone: Not on file    Gets together: Not on file    Attends religious service: Not on file    Active member of club or organization: Not on file    Attends meetings of clubs or organizations: Not on file    Relationship status: Not on file  . Intimate partner violence:    Fear of current or ex partner: Not on file    Emotionally abused: Not on file    Physically abused: Not on file    Forced sexual activity: Not on file  Other Topics Concern  . Not on file  Social History Narrative  . Not on file   History reviewed. No pertinent family history. No current facility-administered medications on file prior to encounter.    No current outpatient medications on file prior to encounter.   No Known Allergies  I have reviewed patient's Past Medical Hx, Surgical Hx, Family Hx, Social Hx,  medications and allergies.   Review of Systems  Constitutional: Negative.   Gastrointestinal: Positive for abdominal pain.  Genitourinary: Positive for vaginal bleeding.  Musculoskeletal: Positive for back pain.  Neurological: Negative for dizziness.    OBJECTIVE Patient Vitals for the past 24 hrs:  BP Temp Temp src Pulse Resp Height Weight  01/24/18 1229 109/69 - - 82 18 - -  01/24/18 0830 112/67 98.3 F (36.8 C) Oral 88 18 5\' 2"  (1.575 m) 63.7 kg   Constitutional: Well-developed, well-nourished female in no acute distress.  Cardiovascular: normal rate & rhythm, no murmur Respiratory: normal rate and effort. Lung sounds clear throughout GI: Abd soft, non-tender, Pos BS x 4. No guarding or rebound tenderness MS: Extremities nontender, no edema, normal ROM Neurologic: Alert and oriented x 4.  GU:     SPECULUM EXAM: NEFG, moderate amount of dark red blood & 2 large clots removed from vagina. Small  amount of blood coming from os.       LAB RESULTS Results for orders placed or performed during the hospital encounter of 01/24/18 (from the past 24 hour(s))  CBC     Status: None   Collection Time: 01/24/18  9:34 AM  Result Value Ref Range   WBC 7.8 4.0 - 10.5 K/uL   RBC 4.24 3.87 - 5.11 MIL/uL   Hemoglobin 13.1 12.0 - 15.0 g/dL   HCT 16.139.0 09.636.0 - 04.546.0 %   MCV 92.0 80.0 - 100.0 fL   MCH 30.9 26.0 - 34.0 pg   MCHC 33.6 30.0 - 36.0 g/dL   RDW 40.912.4 81.111.5 - 91.415.5 %   Platelets 270 150 - 400 K/uL   nRBC 0.0 0.0 - 0.2 %    IMAGING [place] Ob Transvaginal  Result Date: 01/24/2018 CLINICAL DATA:  Pregnant patient with vaginal bleeding. EXAM: TRANSVAGINAL OB ULTRASOUND TECHNIQUE: Transvaginal ultrasound was performed for complete evaluation of the gestation as well as the maternal uterus, adnexal regions, and pelvic cul-de-sac. COMPARISON:  Pelvic ultrasound 01/15/2018; pelvic ultrasound 01/10/2018 FINDINGS: Intrauterine gestational sac: Single Yolk sac:  Not Visualized. Embryo:  Visualized. Cardiac Activity: Not Visualized. Heart Rate: 0 bpm CRL:   2.9 mm   5 w 5 d                  US EDC: 09/21/2018 Subchorionic hemorrhage:  None visualized. Maternal uterus/adnexae: Normal right and left ovaries. No free fluid in the pelvis. IMPRESSION: Intrauterine gestational sac and fetal pole are demonstrated. No fetal heartbeat identified. Findings meet definitive criteria for failed pregnancy. This follows SRU consensus guidelines: Diagnostic Criteria for Nonviable Pregnancy Early in the First Trimester. Macy Mis Engl J Med [id number];369:1443-51. Electronically Signed   By: Annia Beltrew  Davis M.D.   On: 01/24/2018 11:33    MAU COURSE Orders Placed This Encounter  Procedures  . US OB Transvaginal  . CBC  . Discharge patient   Meds ordered this encounter  Medications  . oxyCODONE-acetaminophen (PERCOCET/ROXICET) 5-325 MG per tablet 2 tablet  . oxyCODONE-acetaminophen (PERCOCET) 5-325 MG tablet    Sig: Take 1 tablet by mouth  every 4 (four) hours as needed for up to 5 days for severe pain.    Dispense:  20 tablet    Refill:  0    Order Specific Question:   Supervising Provider    Answer:   Conan BowensDAVIS, KELLY M [[id number]]    MDM RH positive Percocet for pain CBC & ultrasound pending HGB stable. Pain improved with medication. Ultrasound definitive for failed pregnancy.   Discussed  options for management of incomplete AB including expectant management, Cytotec or D&C. Prefers expectant management at this time. Verbalizes understanding that intervention may become necessary if SAB in not completed spontaneously or if heavy bleeding or infection occur.   ASSESSMENT 1. Miscarriage   2. Vaginal bleeding in pregnancy, first trimester     PLAN Discharge home in stable condition. Rx percocet  Discussed reasons to return to MAU Msg sent to cancel future ultrasound Msg to clinic for f/u appts  Follow-up Information    [facility].   Specialty:  Obstetrics and Gynecology Contact information: [address] [place] [address] [phone]         Allergies as of 01/24/2018   No Known Allergies     Medication List    TAKE these medications   oxyCODONE-acetaminophen 5-325 MG tablet Commonly known as:  PERCOCET Take 1 tablet by mouth every 4 (four) hours as needed for up to 5 days for severe pain.        Judeth Horn, NP 01/24/2018  12:36 PM

## 2018-01-24 NOTE — MAU Note (Signed)
Passed a big clot this morning, states it was the size of her palm, states she has passed several since then  Reports she is having bleeding, changed her pad 2 times this morning within 30 min  Having lower abdominal pain

## 2018-01-28 ENCOUNTER — Telehealth: Payer: Self-pay | Admitting: Family Medicine

## 2018-01-28 ENCOUNTER — Other Ambulatory Visit: Payer: Self-pay | Admitting: *Deleted

## 2018-01-28 DIAGNOSIS — O039 Complete or unspecified spontaneous abortion without complication: Secondary | ICD-10-CM

## 2018-01-28 DIAGNOSIS — O3680X0 Pregnancy with inconclusive fetal viability, not applicable or unspecified: Secondary | ICD-10-CM

## 2018-01-28 NOTE — Telephone Encounter (Signed)
Informed patient of both appointments with interrupter.

## 2018-01-29 ENCOUNTER — Ambulatory Visit (HOSPITAL_COMMUNITY): Payer: Self-pay

## 2018-02-01 ENCOUNTER — Other Ambulatory Visit: Payer: Self-pay

## 2018-02-01 DIAGNOSIS — O039 Complete or unspecified spontaneous abortion without complication: Secondary | ICD-10-CM

## 2018-02-01 NOTE — Progress Notes (Unsigned)
Patient asked multiple questions regarding her bleeding at lab appt today. Patient states she has been bleeding heavy off/on since Sunday of last week. Discussed with patient heavy bleeding is expected with a miscarriage so long as she isn't saturating an entire pad in less than an hour over several hours or feeling lightheaded/dizziness. Patient denies those symptoms. Patient states someone talked to her about possibly getting a pill today to help her have more contractions. Discussed with patient the lab results will let us know how things are progressing and if that is necessary or not. Told patient it likely would not be since she has been bleeding heavy. Patient verbalized understanding & had no other questions. Gardiner Ramus, Spanish Interpreter, used for today's encounter.  Chase Caller RN BSN 02/01/18

## 2018-02-02 LAB — BETA HCG QUANT (REF LAB): HCG QUANT: 4641 m[IU]/mL

## 2018-02-11 ENCOUNTER — Encounter: Payer: Self-pay | Admitting: Obstetrics and Gynecology

## 2018-02-11 ENCOUNTER — Ambulatory Visit (INDEPENDENT_AMBULATORY_CARE_PROVIDER_SITE_OTHER): Payer: Self-pay | Admitting: Obstetrics and Gynecology

## 2018-02-11 VITALS — BP 105/63 | HR 81 | Wt 142.0 lb

## 2018-02-11 DIAGNOSIS — O039 Complete or unspecified spontaneous abortion without complication: Secondary | ICD-10-CM | POA: Insufficient documentation

## 2018-02-11 NOTE — Progress Notes (Signed)
S:  Kathleen Estrada is a 28 y.o. female here for a f/u. She was diagnosed in MAU on 12/15 with SAB. She chose expectant mamangement. She says she started bleeding on Dec 17/18; the bleeding has been every day. This week it changed to a coffee color. The bleeding waxes and wanes from dark to red. No dizziness. No intercourse.  Bleeding is not heavy now, feels like a menstrual cycle   O positive blood type  Quant on 12/15: 32,517 Quant on 1/6: 4,641  O:  Today's Vitals   02/11/18 1421  BP: 105/63  Pulse: 81  Weight: 142 lb (64.4 kg)   Body mass index is 25.97 kg/m.   GENERAL: Well-developed, well-nourished female in no acute distress.  LUNGS: Effort normal SKIN: Warm, dry and without erythema PSYCH: Normal mood and affect   A:  SAB follow up   P:  Quant today F/U in one week unless Quant reaches 0, If quant is elevated again will consider [place].    Venia Carbon I, NP 02/11/2018 2:26 PM

## 2018-02-12 LAB — BETA HCG QUANT (REF LAB): hCG Quant: 1244 m[IU]/mL

## 2018-02-25 ENCOUNTER — Encounter: Payer: Self-pay | Admitting: Obstetrics and Gynecology

## 2018-02-25 ENCOUNTER — Ambulatory Visit (INDEPENDENT_AMBULATORY_CARE_PROVIDER_SITE_OTHER): Payer: Self-pay | Admitting: Obstetrics and Gynecology

## 2018-02-25 VITALS — BP 110/72 | HR 77 | Wt 142.2 lb

## 2018-02-25 DIAGNOSIS — O039 Complete or unspecified spontaneous abortion without complication: Secondary | ICD-10-CM

## 2018-02-25 NOTE — Progress Notes (Signed)
S:  Kathleen Estrada is a 28 y.o. female here for a f/u. She was diagnosed in MAU on 12/15 with SAB. She chose expectant mamangement. She says she started bleeding on Dec 17/18; the bleeding has been every day. This week it changed to a coffee color. The bleeding waxes and wanes from dark to red. No dizziness. No intercourse.  Bleeding is not heavy now, feels like a menstrual cycle. She feels much more like herself. She is stopped bleeding all together and then yesterday she went to the gym yesterday and started having bleeding again. No pain.    O positive blood type  Quant on 12/15: 32,517 Quant on 1/6: 4,641 Quant on 1/16: 1,244   O:  Vitals:   02/25/18 1532  BP: 110/72  Pulse: 77  Weight: 142 lb 3.2 oz (64.5 kg)    GENERAL: Well-developed, well-nourished female in no acute distress.  LUNGS: Effort normal SKIN: Warm, dry and without erythema PSYCH: Normal mood and affect   A:  SAB follow up O positive blood type    P:  Quant today F/U in one week unless Quant reaches 0    Venia Carbonasch, Kainat Pizana I, NP 02/25/2018 4:02 PM

## 2018-02-25 NOTE — Progress Notes (Signed)
2 days ago spotting, yesterday felt better- went to gym and after that had heavy bleeding. Today bleeding like normal period but is dark.

## 2018-02-26 LAB — BETA HCG QUANT (REF LAB): hCG Quant: 245 m[IU]/mL

## 2019-01-28 NOTE — L&D Delivery Note (Addendum)
Operative Delivery Note At 10:45 AM a viable female was delivered via VBAC, Vacuum Assisted.  Presentation: vertex; Position: Left,, Occiput,, Anterior; Station: +2. Nuchal cord x 1. Delayed cord clamping performed for 1 minute.  Verbal consent: obtained from patient.  Risks and benefits discussed in detail.  Risks include, but are not limited to the risks of anesthesia, bleeding, infection, damage to maternal tissues, fetal cephalhematoma.  There is also the risk of inability to effect vaginal delivery of the head, or shoulder dystocia that cannot be resolved by established maneuvers, leading to the need for emergency cesarean section.  APGAR: 8, 9; weight unavailable at the time of the note .   Placenta status: delivered whole and intact with 3-vessel cord.  Anesthesia:  Epidural Instruments: Kiwi Vaccum Episiotomy:  none Lacerations:  Second degree laceration with bilateral first degree lacerations which are hemostatic Suture Repair: 2.0 vicryl Est. Blood Loss (mL):  300 1000 mcg cytotec per rectum placed due to intermittent uterine atony Mom to postpartum.  Baby to Couplet care / Skin to Skin.  Yasemin Rabon 10/02/2019, 11:13 AM

## 2019-03-21 LAB — OB RESULTS CONSOLE GC/CHLAMYDIA
Chlamydia: NEGATIVE
Gonorrhea: NEGATIVE

## 2019-03-21 LAB — OB RESULTS CONSOLE RUBELLA ANTIBODY, IGM: Rubella: IMMUNE

## 2019-03-21 LAB — OB RESULTS CONSOLE ANTIBODY SCREEN: Antibody Screen: NEGATIVE

## 2019-03-21 LAB — OB RESULTS CONSOLE HEPATITIS B SURFACE ANTIGEN: Hepatitis B Surface Ag: NEGATIVE

## 2019-03-21 LAB — OB RESULTS CONSOLE HIV ANTIBODY (ROUTINE TESTING): HIV: NONREACTIVE

## 2019-03-21 LAB — OB RESULTS CONSOLE ABO/RH: RH Type: POSITIVE

## 2019-03-21 LAB — OB RESULTS CONSOLE RPR: RPR: NONREACTIVE

## 2019-03-23 ENCOUNTER — Other Ambulatory Visit: Payer: Self-pay

## 2019-03-23 ENCOUNTER — Ambulatory Visit (HOSPITAL_COMMUNITY): Payer: Self-pay | Admitting: *Deleted

## 2019-03-23 ENCOUNTER — Ambulatory Visit (HOSPITAL_COMMUNITY)
Admission: RE | Admit: 2019-03-23 | Discharge: 2019-03-23 | Disposition: A | Discharge status: Home / Self Care | Payer: Self-pay | Source: Ambulatory Visit | Attending: Obstetrics and Gynecology | Admitting: Obstetrics and Gynecology

## 2019-03-23 ENCOUNTER — Ambulatory Visit (HOSPITAL_COMMUNITY): Payer: Self-pay

## 2019-03-23 ENCOUNTER — Other Ambulatory Visit (HOSPITAL_COMMUNITY): Payer: Self-pay | Admitting: Nurse Practitioner

## 2019-03-23 ENCOUNTER — Other Ambulatory Visit: Payer: Self-pay | Admitting: Obstetrics

## 2019-03-23 ENCOUNTER — Encounter (HOSPITAL_COMMUNITY): Payer: Self-pay

## 2019-03-23 VITALS — BP 117/72 | HR 102 | Temp 98.0°F

## 2019-03-23 DIAGNOSIS — Z369 Encounter for antenatal screening, unspecified: Secondary | ICD-10-CM

## 2019-03-23 DIAGNOSIS — Z3682 Encounter for antenatal screening for nuchal translucency: Secondary | ICD-10-CM | POA: Insufficient documentation

## 2019-03-23 DIAGNOSIS — Z1379 Encounter for other screening for genetic and chromosomal anomalies: Secondary | ICD-10-CM

## 2019-03-23 DIAGNOSIS — Z3A13 13 weeks gestation of pregnancy: Secondary | ICD-10-CM

## 2019-03-23 DIAGNOSIS — O34219 Maternal care for unspecified type scar from previous cesarean delivery: Secondary | ICD-10-CM

## 2019-03-30 ENCOUNTER — Telehealth (HOSPITAL_COMMUNITY): Payer: Self-pay | Admitting: Genetic Counselor

## 2019-03-30 LAB — FIRST TRIMESTER SCREEN W/NT
CRL: 73.3 mm
DIA MoM: 0.57
DIA Value: 123.2 pg/mL
Gest Age-Collect: 13.3 weeks
Maternal Age At EDD: 29.9 yr
Nuchal Translucency MoM: 0.93
Nuchal Translucency: 1.9 mm
Number of Fetuses: 1
PAPP-A MoM: 0.56
PAPP-A Value: 740.9 ng/mL
Test Results:: NEGATIVE
Weight: 140 [lb_av]
hCG MoM: 0.55
hCG Value: 46.7 IU/mL

## 2019-03-30 NOTE — Telephone Encounter (Signed)
I called Kathleen Estrada with the help of a [employer], ID# [phone] to discuss her negative first trimester screen results. We reviewed that the risk for her pregnancy to be affected by Down syndrome decreased from her 1 in 701 age-related risk to less than 1 in 10,000, and the risk for trisomy 18 decreased from her 1 in 1372 age-related risk to less than 1 in 10,000 based on the results of this screen. Kathleen Estrada was reminded that while this screen significantly reduces the likelihood of the pregnancy being affected by trisomy 32 or trisomy 48, it cannot be considered diagnostic. Additionally, it does not screen for all possible genetic conditions. Diagnostic testing via amniocentesis is available any time after 16 weeks' gestation should Kathleen Estrada be interested in pursuing this. Additionally, first trimester screening does not screen for open neural tube defects such as spina bifida. It is recommended that her OBGYN provider order MS-AFP screening around 16-18 weeks to screen for this. Kathleen Estrada confirmed that she had no questions about these results.  Gershon Crane, MS, [facility] Genetic Counselor

## 2019-08-02 ENCOUNTER — Other Ambulatory Visit: Payer: Self-pay

## 2019-08-02 ENCOUNTER — Encounter (HOSPITAL_COMMUNITY): Payer: Self-pay | Admitting: Obstetrics and Gynecology

## 2019-08-02 ENCOUNTER — Inpatient Hospital Stay (HOSPITAL_COMMUNITY)
Admission: AD | Admit: 2019-08-02 | Discharge: 2019-08-02 | Disposition: A | Discharge status: Home / Self Care | Payer: Self-pay | Attending: Obstetrics and Gynecology | Admitting: Obstetrics and Gynecology

## 2019-08-02 DIAGNOSIS — N760 Acute vaginitis: Secondary | ICD-10-CM

## 2019-08-02 DIAGNOSIS — O26893 Other specified pregnancy related conditions, third trimester: Secondary | ICD-10-CM | POA: Insufficient documentation

## 2019-08-02 DIAGNOSIS — Z3A32 32 weeks gestation of pregnancy: Secondary | ICD-10-CM

## 2019-08-02 DIAGNOSIS — B9689 Other specified bacterial agents as the cause of diseases classified elsewhere: Secondary | ICD-10-CM

## 2019-08-02 DIAGNOSIS — O26853 Spotting complicating pregnancy, third trimester: Secondary | ICD-10-CM

## 2019-08-02 DIAGNOSIS — O99891 Other specified diseases and conditions complicating pregnancy: Secondary | ICD-10-CM

## 2019-08-02 LAB — WET PREP, GENITAL
Sperm: NONE SEEN
Trich, Wet Prep: NONE SEEN
Yeast Wet Prep HPF POC: NONE SEEN

## 2019-08-02 LAB — URINALYSIS, ROUTINE W REFLEX MICROSCOPIC
Bilirubin Urine: NEGATIVE
Glucose, UA: NEGATIVE mg/dL
Ketones, ur: NEGATIVE mg/dL
Nitrite: NEGATIVE
Protein, ur: NEGATIVE mg/dL
Specific Gravity, Urine: 1.008 (ref 1.005–1.030)
pH: 7 (ref 5.0–8.0)

## 2019-08-02 MED ORDER — METRONIDAZOLE 500 MG PO TABS: 500.0000 mg | ORAL_TABLET | Freq: Two times a day (BID) | ORAL | 0 refills | Status: AC

## 2019-08-02 NOTE — MAU Note (Addendum)
Presents with c/o pinkish vaginal discharge.  States woke up voided and when wiped had coffee colored discharge with wiping.  Denies recent intercourse.  Endorses +FM.  Denies LOF.

## 2019-08-02 NOTE — MAU Provider Note (Addendum)
History     CSN: [id number]  Arrival date and time: 08/02/19 1032   First Provider Initiated Contact with Patient 08/02/19 1209 - in-house Spanish interpreter, Wallene Huh, assisting for entire visit      Chief Complaint  Patient presents with  . Vaginal Bleeding   [name] is a 29 y.o. year old G62P1021 female at [redacted]w[redacted]d weeks gestation who presents to MAU reporting pink vaginal discharge this morning at 0700 when she wiped, vaginal discharge alert coffee colored at 0730 with wiping then at 0900 the discharge was back to being pink. She denies any recent sexual intercourse; last intercourse was 2 weeks ago. She reports positive fetal movement. She denies any loss of fluid or abnormal vaginal discharge. She receives her prenatal care at [facility] department. Her last appointment was 1-1/2 weeks ago; her next appointment is July 8.   OB History    Gravida  4   Para  1   Term  1   Preterm      AB  2   Living  1     SAB  2   TAB      Ectopic      Multiple      Live Births  1           Past Medical History:  Diagnosis Date  . Medical history non-contributory     Past Surgical History:  Procedure Laterality Date  . CESAREAN SECTION  2011    History reviewed. No pertinent family history.  Social History   Tobacco Use  . Smoking status: Never Smoker  . Smokeless tobacco: Never Used  Vaping Use  . Vaping Use: Never used  Substance Use Topics  . Alcohol use: No  . Drug use: No    Allergies: No Known Allergies  Medications Prior to Admission  Medication Sig Dispense Refill Last Dose  . folic acid (FOLVITE) 1 MG tablet Take 1 mg by mouth daily.   07/29/2019 at Unknown time  . Prenatal Vit-Fe Fumarate-FA (PRENATAL VITAMIN PO) Take 1 tablet by mouth daily.    07/29/2019 at Unknown time    Review of Systems  Constitutional: Negative.   HENT: Negative.   Eyes: Negative.   Respiratory: Negative.   Cardiovascular: Negative.    Gastrointestinal: Negative.   Endocrine: Negative.   Genitourinary: Positive for vaginal bleeding and vaginal discharge.  Musculoskeletal: Negative.   Skin: Negative.   Allergic/Immunologic: Negative.   Neurological: Negative.   Hematological: Negative.   Psychiatric/Behavioral: Negative.    Physical Exam   Blood pressure 105/66, pulse 85, temperature 98.3 F (36.8 C), temperature source Oral, resp. rate 20, height 5\' 2"  (1.575 m), weight 71 kg, last menstrual period 12/19/2018, SpO2 98 %, unknown if currently breastfeeding.  Physical Exam Vitals and nursing note reviewed. Exam conducted with a chaperone present.  Constitutional:      Appearance: Normal appearance.  HENT:     Head: Normocephalic and atraumatic.  Cardiovascular:     Rate and Rhythm: Normal rate.  Pulmonary:     Effort: Pulmonary effort is normal.  Genitourinary:    Comments: Uterus: gravid, S=D, SE: cervix is smooth, pink, no lesions, small amt of thick, white vaginal d/c -- WP, GC/CT done, closed/long/firm, no CMT, (+) friability with specimen collections, no adnexal tenderness  Musculoskeletal:        General: Normal range of motion.     Cervical back: Normal range of motion.  Skin:    General: Skin is  warm and dry.  Neurological:     General: No focal deficit present.     Mental Status: She is alert and oriented to person, place, and time.  Psychiatric:        Mood and Affect: Mood normal.        Behavior: Behavior normal.        Thought Content: Thought content normal.        Judgment: Judgment normal.    REACTIVE NST - FHR: 150 bpm / moderate variability / accels present / decels absent / TOCO: none noted MAU Course  Procedures  MDM CCUA Wet Prep GC/CT -- Results pending  Results for orders placed or performed during the hospital encounter of 08/02/19 (from the past 24 hour(s))  Urinalysis, Routine w reflex microscopic     Status: Abnormal   Collection Time: 08/02/19 11:25 AM  Result Value  Ref Range   Color, Urine STRAW (A) YELLOW   APPearance HAZY (A) CLEAR   Specific Gravity, Urine 1.008 1.005 - 1.030   pH 7.0 5.0 - 8.0   Glucose, UA NEGATIVE NEGATIVE mg/dL   Hgb urine dipstick MODERATE (A) NEGATIVE   Bilirubin Urine NEGATIVE NEGATIVE   Ketones, ur NEGATIVE NEGATIVE mg/dL   Protein, ur NEGATIVE NEGATIVE mg/dL   Nitrite NEGATIVE NEGATIVE   Leukocytes,Ua TRACE (A) NEGATIVE   RBC / HPF 0-5 0 - 5 RBC/hpf   WBC, UA 0-5 0 - 5 WBC/hpf   Bacteria, UA RARE (A) NONE SEEN   Squamous Epithelial / LPF 11-20 0 - 5   Mucus PRESENT   Wet prep, genital     Status: Abnormal   Collection Time: 08/02/19 12:30 PM   Specimen: Cervix  Result Value Ref Range   Yeast Wet Prep HPF POC NONE SEEN NONE SEEN   Trich, Wet Prep NONE SEEN NONE SEEN   Clue Cells Wet Prep HPF POC PRESENT (A) NONE SEEN   WBC, Wet Prep HPF POC MANY (A) NONE SEEN   Sperm NONE SEEN     Assessment and Plan  Spotting affecting pregnancy in third trimester - Reassurance given that bleeding seen from surface of cervical tissue and not out of the cervix; which means the spotting is more than likely from irritation of cervical tissue from BV  Bacterial vaginosis  - Rx for Flagyl 500 mg BID x 7 days - Information provided on BV  - Discharge patient - Keep scheduled appt with [facility] on 08/04/2019 - Patient verbalized an understanding of the plan of care and agrees.    Raelyn Mora, MSN, CNM 08/02/2019, 12:10 PM

## 2019-08-03 LAB — GC/CHLAMYDIA PROBE AMP (~~LOC~~) NOT AT ARMC
Chlamydia: NEGATIVE
Comment: NEGATIVE
Comment: NORMAL
Neisseria Gonorrhea: NEGATIVE

## 2019-09-01 LAB — OB RESULTS CONSOLE GC/CHLAMYDIA
Chlamydia: NEGATIVE
Gonorrhea: NEGATIVE

## 2019-09-01 LAB — OB RESULTS CONSOLE GBS: GBS: NEGATIVE

## 2019-09-27 ENCOUNTER — Telehealth (HOSPITAL_COMMUNITY): Payer: Self-pay | Admitting: *Deleted

## 2019-09-27 NOTE — Telephone Encounter (Signed)
[id number] interpreter number

## 2019-09-28 ENCOUNTER — Encounter (HOSPITAL_COMMUNITY): Payer: Self-pay | Admitting: *Deleted

## 2019-09-28 ENCOUNTER — Telehealth (HOSPITAL_COMMUNITY): Payer: Self-pay | Admitting: *Deleted

## 2019-09-28 ENCOUNTER — Other Ambulatory Visit: Payer: Self-pay | Admitting: Advanced Practice Midwife

## 2019-09-28 NOTE — Telephone Encounter (Signed)
[id number] interpreter number

## 2019-09-30 ENCOUNTER — Other Ambulatory Visit (HOSPITAL_COMMUNITY)
Admission: RE | Admit: 2019-09-30 | Discharge: 2019-09-30 | Disposition: A | Discharge status: Home / Self Care | Payer: Self-pay | Source: Ambulatory Visit | Attending: Family Medicine | Admitting: Family Medicine

## 2019-09-30 DIAGNOSIS — Z01812 Encounter for preprocedural laboratory examination: Secondary | ICD-10-CM | POA: Insufficient documentation

## 2019-09-30 DIAGNOSIS — Z20822 Contact with and (suspected) exposure to covid-19: Secondary | ICD-10-CM | POA: Insufficient documentation

## 2019-09-30 LAB — SARS CORONAVIRUS 2 (TAT 6-24 HRS): SARS Coronavirus 2: NEGATIVE

## 2019-10-01 ENCOUNTER — Other Ambulatory Visit: Payer: Self-pay

## 2019-10-01 ENCOUNTER — Inpatient Hospital Stay (HOSPITAL_COMMUNITY): Payer: Medicaid Other | Admitting: Anesthesiology

## 2019-10-01 ENCOUNTER — Inpatient Hospital Stay (HOSPITAL_COMMUNITY)
Admission: AD | Admit: 2019-10-01 | Discharge: 2019-10-04 | DRG: 807 | Disposition: A | Discharge status: Home / Self Care | Payer: Medicaid Other | Attending: Obstetrics and Gynecology | Admitting: Obstetrics and Gynecology

## 2019-10-01 ENCOUNTER — Encounter (HOSPITAL_COMMUNITY): Payer: Self-pay | Admitting: Obstetrics and Gynecology

## 2019-10-01 DIAGNOSIS — O480 Post-term pregnancy: Secondary | ICD-10-CM | POA: Diagnosis present

## 2019-10-01 DIAGNOSIS — Z3A40 40 weeks gestation of pregnancy: Secondary | ICD-10-CM | POA: Diagnosis not present

## 2019-10-01 DIAGNOSIS — O34219 Maternal care for unspecified type scar from previous cesarean delivery: Secondary | ICD-10-CM | POA: Diagnosis present

## 2019-10-01 DIAGNOSIS — O368130 Decreased fetal movements, third trimester, not applicable or unspecified: Principal | ICD-10-CM | POA: Diagnosis present

## 2019-10-01 DIAGNOSIS — Z20822 Contact with and (suspected) exposure to covid-19: Secondary | ICD-10-CM | POA: Diagnosis present

## 2019-10-01 DIAGNOSIS — O368131 Decreased fetal movements, third trimester, fetus 1: Secondary | ICD-10-CM | POA: Diagnosis not present

## 2019-10-01 DIAGNOSIS — O34211 Maternal care for low transverse scar from previous cesarean delivery: Secondary | ICD-10-CM | POA: Diagnosis not present

## 2019-10-01 DIAGNOSIS — Z3A41 41 weeks gestation of pregnancy: Secondary | ICD-10-CM | POA: Diagnosis present

## 2019-10-01 LAB — CBC
HCT: 40.7 % (ref 36.0–46.0)
Hemoglobin: 13.4 g/dL (ref 12.0–15.0)
MCH: 31.1 pg (ref 26.0–34.0)
MCHC: 32.9 g/dL (ref 30.0–36.0)
MCV: 94.4 fL (ref 80.0–100.0)
Platelets: 289 10*3/uL (ref 150–400)
RBC: 4.31 MIL/uL (ref 3.87–5.11)
RDW: 14 % (ref 11.5–15.5)
WBC: 9.5 10*3/uL (ref 4.0–10.5)
nRBC: 0 % (ref 0.0–0.2)

## 2019-10-01 LAB — TYPE AND SCREEN
ABO/RH(D): O POS
Antibody Screen: NEGATIVE

## 2019-10-01 MED ORDER — BUPIVACAINE HCL (PF) 0.75 % IJ SOLN
INTRAMUSCULAR | Status: DC | PRN
Start: 2019-10-01 — End: 2019-10-02
  Administered 2019-10-01: 12 mL/h via EPIDURAL

## 2019-10-01 MED ORDER — ONDANSETRON HCL 4 MG/2ML IJ SOLN: 4.0000 mg | Freq: Four times a day (QID) | INTRAMUSCULAR | Status: DC | PRN

## 2019-10-01 MED ORDER — LACTATED RINGERS IV SOLN
500.0000 mL | Freq: Once | INTRAVENOUS | Status: AC
  Administered 2019-10-01: 500 mL via INTRAVENOUS

## 2019-10-01 MED ORDER — OXYCODONE-ACETAMINOPHEN 5-325 MG PO TABS: 2.0000 | ORAL_TABLET | ORAL | Status: DC | PRN

## 2019-10-01 MED ORDER — OXYTOCIN BOLUS FROM INFUSION
333.0000 mL | Freq: Once | INTRAVENOUS | Status: AC
  Administered 2019-10-02: 333 mL via INTRAVENOUS

## 2019-10-01 MED ORDER — LIDOCAINE HCL (PF) 1 % IJ SOLN: 30.0000 mL | INTRAMUSCULAR | Status: DC | PRN

## 2019-10-01 MED ORDER — LIDOCAINE HCL (PF) 1 % IJ SOLN
INTRAMUSCULAR | Status: DC | PRN
  Administered 2019-10-01 (×2): 4 mL via EPIDURAL

## 2019-10-01 MED ORDER — ACETAMINOPHEN 325 MG PO TABS: 650.0000 mg | ORAL_TABLET | ORAL | Status: DC | PRN

## 2019-10-01 MED ORDER — EPHEDRINE 5 MG/ML INJ
10.0000 mg | INTRAVENOUS | Status: DC | PRN
  Filled 2019-10-01: qty 10

## 2019-10-01 MED ORDER — FENTANYL-BUPIVACAINE-NACL 0.5-0.125-0.9 MG/250ML-% EP SOLN
12.0000 mL/h | EPIDURAL | Status: DC | PRN
  Filled 2019-10-01: qty 250

## 2019-10-01 MED ORDER — LACTATED RINGERS IV SOLN
500.0000 mL | INTRAVENOUS | Status: DC | PRN
  Administered 2019-10-01: 1000 mL via INTRAVENOUS
  Administered 2019-10-02 (×3): 500 mL via INTRAVENOUS

## 2019-10-01 MED ORDER — OXYCODONE-ACETAMINOPHEN 5-325 MG PO TABS: 1.0000 | ORAL_TABLET | ORAL | Status: DC | PRN

## 2019-10-01 MED ORDER — FENTANYL CITRATE (PF) 100 MCG/2ML IJ SOLN
100.0000 ug | INTRAMUSCULAR | Status: DC | PRN
  Administered 2019-10-01: 100 ug via INTRAVENOUS
  Filled 2019-10-01: qty 2

## 2019-10-01 MED ORDER — OXYTOCIN-SODIUM CHLORIDE 30-0.9 UT/500ML-% IV SOLN
2.5000 [IU]/h | INTRAVENOUS | Status: DC
  Filled 2019-10-01: qty 500

## 2019-10-01 MED ORDER — DIPHENHYDRAMINE HCL 50 MG/ML IJ SOLN: 12.5000 mg | INTRAMUSCULAR | Status: DC | PRN

## 2019-10-01 MED ORDER — LACTATED RINGERS IV SOLN: INTRAVENOUS | Status: DC

## 2019-10-01 MED ORDER — EPHEDRINE 5 MG/ML INJ
10.0000 mg | INTRAVENOUS | Status: DC | PRN
  Administered 2019-10-02: 10 mg via INTRAVENOUS

## 2019-10-01 MED ORDER — PHENYLEPHRINE 40 MCG/ML (10ML) SYRINGE FOR IV PUSH (FOR BLOOD PRESSURE SUPPORT)
80.0000 ug | PREFILLED_SYRINGE | INTRAVENOUS | Status: DC | PRN
  Filled 2019-10-01: qty 10

## 2019-10-01 MED ORDER — PHENYLEPHRINE 40 MCG/ML (10ML) SYRINGE FOR IV PUSH (FOR BLOOD PRESSURE SUPPORT): 80.0000 ug | PREFILLED_SYRINGE | INTRAVENOUS | Status: DC | PRN

## 2019-10-01 MED ORDER — SOD CITRATE-CITRIC ACID 500-334 MG/5ML PO SOLN: 30.0000 mL | ORAL | Status: DC | PRN

## 2019-10-01 NOTE — Progress Notes (Signed)
10 Instruments  5 9*9 5 4*18 3 Injectables

## 2019-10-01 NOTE — Progress Notes (Addendum)
Kathleen Estrada is a 29 y.o. [id number] at [redacted]w[redacted]d by LMP admitted for induction of labor due to [facility] and postterm pregnancy.  Subjective: Pt breathing with contractions, s/o in room for support.  Objective: BP 119/75   Pulse 82   Temp 98.9 F (37.2 C) (Oral)   Resp 16   LMP 12/19/2018 (Exact Date)  No intake/output data recorded. No intake/output data recorded.  FHT:  FHR: 145 bpm, variability: moderate,  accelerations:  Present,  decelerations:  Absent UC:  irregular, every 3-6 minutes SVE:   Dilation: 6 Effacement (%): 50 Station: -3 Exam by:: Maizee Reinhold L. CNM  Foley bulb found to be out of cervix on exam and removed.    Labs: Lab Results  Component Value Date   WBC 9.5 10/01/2019   HGB 13.4 10/01/2019   HCT 40.7 10/01/2019   MCV 94.4 10/01/2019   PLT 289 10/01/2019    Assessment / Plan: Induction of labor due to DFM S/P foley bulb   Labor: Progressing normally  During exam after FB removal, tissue palpated to side of fetal head, no pulse palpable. [place] brought to bedside and vertex position confirmed, no cord noted in pelvis or near fetal head. Pt reexamined tissue not palpated at this time.  Cervix changed from 4-5 to 6 centimeters between these exams and has bulging bag of water.  Pt to get epidural as desired and will hold on further augmentation at this time. Preeclampsia:  n/a Fetal Wellbeing:  Category I Pain Control:  IV pain meds I/D:  GBS neg Anticipated MOD:  VBAC  Sharen Counter 10/01/2019, 9:46 PM

## 2019-10-01 NOTE — H&P (Signed)
Kathleen Estrada is a 29 y.o. female [phone] with IUP at [redacted]w[redacted]d by LMP  presenting for decreased fetal movements. She reported that she had decreased fetal movements overnight but now feels more movements.   Prenatal History/Complications: PNC at [facility] Pregnancy complications: C/s in 2011 due to arrest of dilation.  - Past Medical History: Past Medical History:  Diagnosis Date  . Medical history non-contributory     Past Surgical History: Past Surgical History:  Procedure Laterality Date  . CESAREAN SECTION  2011    Obstetrical History: OB History    Gravida  4   Para  1   Term  1   Preterm      AB  2   Living  1     SAB  2   TAB      Ectopic      Multiple      Live Births  1            Social History: Social History   Socioeconomic History  . Marital status: Married    Spouse name: Not on file  . Number of children: Not on file  . Years of education: Not on file  . Highest education level: Not on file  Occupational History  . Not on file  Tobacco Use  . Smoking status: Never Smoker  . Smokeless tobacco: Never Used  Vaping Use  . Vaping Use: Never used  Substance and Sexual Activity  . Alcohol use: No  . Drug use: No  . Sexual activity: Not Currently    Birth control/protection: None  Other Topics Concern  . Not on file  Social History Narrative  . Not on file   Social Determinants of Health   Financial Resource Strain:   . Difficulty of Paying Living Expenses: Not on file  Food Insecurity:   . Worried About Programme researcher, broadcasting/film/video in the Last Year: Not on file  . Ran Out of Food in the Last Year: Not on file  Transportation Needs:   . Lack of Transportation (Medical): Not on file  . Lack of Transportation (Non-Medical): Not on file  Physical Activity:   . Days of Exercise per Week: Not on file  . Minutes of Exercise per Session: Not on file  Stress:   . Feeling of Stress : Not on file  Social Connections:   . Frequency of  Communication with Friends and Family: Not on file  . Frequency of Social Gatherings with Friends and Family: Not on file  . Attends Religious Services: Not on file  . Active Member of Clubs or Organizations: Not on file  . Attends Banker Meetings: Not on file  . Marital Status: Not on file    Family History: History reviewed. No pertinent family history.  Allergies: No Known Allergies  Medications Prior to Admission  Medication Sig Dispense Refill Last Dose  . folic acid (FOLVITE) 1 MG tablet Take 1 mg by mouth daily.     . Prenatal Vit-Fe Fumarate-FA (PRENATAL VITAMIN PO) Take 1 tablet by mouth daily.        Review of Systems   Constitutional: Negative for fever and chills Eyes: Negative for visual disturbances Respiratory: Negative for shortness of breath, dyspnea Cardiovascular: Negative for chest pain or palpitations  Gastrointestinal: Negative for vomiting, diarrhea and constipation.  POSITIVE for abdominal pain (contractions) Genitourinary: Negative for dysuria and urgency Musculoskeletal: Negative for back pain, joint pain, myalgias  Neurological: Negative for dizziness and headaches  Blood pressure 96/74, pulse 85, temperature 98.4 F (36.9 C), temperature source Oral, resp. rate 17, last menstrual period 12/19/2018, unknown if currently breastfeeding. General appearance: alert, cooperative and no distress Lungs: normal respiratory effort Heart: regular rate and rhythm Abdomen: soft, non-tender; bowel sounds normal Extremities: Homans sign is negative, no sign of DVT DTR's 2+ Presentation: cephalic Fetal monitoring  Baseline: 150 bpm; present acel, no decels, no contractions.  Uterine activity  None     Prenatal labs: ABO, Rh: --/--/PENDING (09/04 1458) Antibody: PENDING (09/04 1458) Rubella: Immune (02/22 0000) RPR: Nonreactive (02/22 0000)  HBsAg: Negative (02/22 0000)  HIV: Non-reactive (02/22 0000)  GBS: Negative/-- (08/05 0000)  1 hr  Glucola 58 Genetic screening  Negative (did first screen) Anatomy US normal  Prenatal Transfer Tool  Maternal Diabetes: No Genetic Screening: Normal Maternal Ultrasounds/Referrals: Normal Fetal Ultrasounds or other Referrals:  None Maternal Substance Abuse:  No Significant Maternal Medications:  None Significant Maternal Lab Results: Group B Strep negative  Results for orders placed or performed during the hospital encounter of 10/01/19 (from the past 24 hour(s))  CBC   Collection Time: 10/01/19  2:58 PM  Result Value Ref Range   WBC 9.5 4.0 - 10.5 K/uL   RBC 4.31 3.87 - 5.11 MIL/uL   Hemoglobin 13.4 12.0 - 15.0 g/dL   HCT 18.2 36 - 46 %   MCV 94.4 80.0 - 100.0 fL   MCH 31.1 26.0 - 34.0 pg   MCHC 32.9 30.0 - 36.0 g/dL   RDW 99.3 71.6 - 96.7 %   Platelets 289 150 - 400 K/uL   nRBC 0.0 0.0 - 0.2 %  Type and screen   Collection Time: 10/01/19  2:58 PM  Result Value Ref Range   ABO/RH(D) PENDING    Antibody Screen PENDING    Sample Expiration      10/04/2019,2359 Performed at [facility], [address], [place], [place] [address]     Assessment: Kathleen Estrada is a 29 y.o. [phone] with an IUP at [redacted]w[redacted]d presenting for decreased fetal movement. Patient has IOL scheduled for tomorrow, desires VBAC.  -Documentation of VBAC consent is noted in chart from 6-11, but no VBAC consent found in electronic records. Patient resigned VBAC consent at [facility].  -FB placed in MAU  Plan: #Labor: expectant management #Pain:  Per request #FWB Cat 1 #ID: GBS: Neg #MOF:  breast #MOC: pills #Circ: no   Marylene Land 10/01/2019, 3:44 PM

## 2019-10-01 NOTE — Anesthesia Preprocedure Evaluation (Signed)
Anesthesia Evaluation  Patient identified by MRN, date of birth, ID band Patient awake    Reviewed: Allergy & Precautions, Patient's Chart, lab work & pertinent test results  Airway Mallampati: II  TM Distance: >3 FB Neck ROM: Full    Dental no notable dental hx.    Pulmonary neg pulmonary ROS,    Pulmonary exam normal breath sounds clear to auscultation       Cardiovascular negative cardio ROS Normal cardiovascular exam Rhythm:Regular Rate:Normal     Neuro/Psych negative neurological ROS  negative psych ROS   GI/Hepatic negative GI ROS, Neg liver ROS,   Endo/Other  negative endocrine ROS  Renal/GU negative Renal ROS     Musculoskeletal negative musculoskeletal ROS (+)   Abdominal   Peds  Hematology negative hematology ROS (+)   Anesthesia Other Findings   Reproductive/Obstetrics (+) Pregnancy                             Anesthesia Physical Anesthesia Plan  ASA: II  Anesthesia Plan: Epidural   Post-op Pain Management:    Induction:   PONV Risk Score and Plan:   Airway Management Planned:   Additional Equipment:   Intra-op Plan:   Post-operative Plan:   Informed Consent: I have reviewed the patients History and Physical, chart, labs and discussed the procedure including the risks, benefits and alternatives for the proposed anesthesia with the patient or authorized representative who has indicated his/her understanding and acceptance.       Plan Discussed with:   Anesthesia Plan Comments:         Anesthesia Quick Evaluation  

## 2019-10-01 NOTE — MAU Note (Addendum)
Pt reporting to mau with c/o DFM since 0945 today.  FHR 150 during triage.  Pt reports some irregular ctx earlier today but denies pain at this time, pt denies LOF.  Pt also c/o sore throat and congestion. Since yesterday.

## 2019-10-01 NOTE — Anesthesia Procedure Notes (Signed)
Epidural °Patient location during procedure: OB ° °Staffing °Anesthesiologist: Trigger Frasier, MD °Performed: anesthesiologist  ° °Preanesthetic Checklist °Completed: patient identified, IV checked, risks and benefits discussed, monitors and equipment checked, pre-op evaluation and timeout performed ° °Epidural °Patient position: sitting °Prep: DuraPrep and site prepped and draped °Patient monitoring: heart rate, continuous pulse ox and blood pressure °Approach: midline °Location: L2-L3 °Injection technique: LOR air and LOR saline ° °Needle:  °Needle type: Tuohy  °Needle gauge: 17 G °Needle length: 9 cm °Needle insertion depth: 5 cm °Catheter type: closed end flexible °Catheter size: 19 Gauge °Catheter at skin depth: 11 cm °Test dose: negative ° °Assessment °Sensory level: T8 °Events: blood not aspirated, injection not painful, no injection resistance, no paresthesia and negative IV test ° °Additional Notes °Reason for block:procedure for pain ° ° ° ° °

## 2019-10-01 NOTE — MAU Provider Note (Signed)
History     CSN: [id number]  Arrival date and time: 10/01/19 1229   First Provider Initiated Contact with Patient 10/01/19 1314      Chief Complaint  Patient presents with  . Decreased Fetal Movement   HPI   Ms.Orena Cavazos is a 29 y.o. female [phone] @ [redacted]w[redacted]d here with decreased fetal movement. Reports movements were decreased as of this morning. While she was in MAU she has felt some movement, and reports movement is now normal. She is scheduled for an induction of labor tomorrow. No pain or bleeding.   OB History    Gravida  4   Para  1   Term  1   Preterm      AB  2   Living  1     SAB  2   TAB      Ectopic      Multiple      Live Births  1           Past Medical History:  Diagnosis Date  . Medical history non-contributory     Past Surgical History:  Procedure Laterality Date  . CESAREAN SECTION  2011    History reviewed. No pertinent family history.  Social History   Tobacco Use  . Smoking status: Never Smoker  . Smokeless tobacco: Never Used  Vaping Use  . Vaping Use: Never used  Substance Use Topics  . Alcohol use: No  . Drug use: No    Allergies: No Known Allergies  Medications Prior to Admission  Medication Sig Dispense Refill Last Dose  . folic acid (FOLVITE) 1 MG tablet Take 1 mg by mouth daily.     . Prenatal Vit-Fe Fumarate-FA (PRENATAL VITAMIN PO) Take 1 tablet by mouth daily.       Results for orders placed or performed during the hospital encounter of 09/30/19 (from the past 48 hour(s))  SARS CORONAVIRUS 2 (TAT 6-24 HRS) Nasopharyngeal Nasopharyngeal Swab     Status: None   Collection Time: 09/30/19  9:45 AM   Specimen: Nasopharyngeal Swab  Result Value Ref Range   SARS Coronavirus 2 NEGATIVE NEGATIVE    Comment: (NOTE) SARS-CoV-2 target nucleic acids are NOT DETECTED.  The SARS-CoV-2 RNA is generally detectable in upper and lower respiratory specimens during the acute phase of infection. Negative results  do not preclude SARS-CoV-2 infection, do not rule out co-infections with other pathogens, and should not be used as the sole basis for treatment or other patient management decisions. Negative results must be combined with clinical observations, patient history, and epidemiological information. The expected result is Negative.  Fact Sheet for Patients: HairSlick.no  Fact Sheet for Healthcare Providers: quierodirigir.com  This test is not yet approved or cleared by the [place] FDA and  has been authorized for detection and/or diagnosis of SARS-CoV-2 by FDA under an Emergency Use Authorization (EUA). This EUA will remain  in effect (meaning this test can be used) for the duration of the COVID-19 declaration under Se ction 564(b)(1) of the Act, 21 U.S.C. section 360bbb-3(b)(1), unless the authorization is terminated or revoked sooner.  Performed at [facility], [address] [place], [place] [address]     Review of Systems  Constitutional: Negative for fever.  Gastrointestinal: Negative for abdominal pain.  Genitourinary: Negative for vaginal bleeding.   Physical Exam   Blood pressure 126/82, pulse (!) 101, temperature 98.1 F (36.7 C), temperature source Oral, resp. rate 16, last menstrual period 12/19/2018, unknown if  currently breastfeeding.  Physical Exam Constitutional:      Appearance: Normal appearance.  HENT:     Head: Normocephalic.  Genitourinary:    Comments: Cervix: fingertip, anterior. Vertex position. Exam by: Venia Carbon, NP  Skin:    General: Skin is warm.  Neurological:     Mental Status: She is alert.  Psychiatric:        Mood and Affect: Mood normal.     Patient informed of R/B/A of procedure. NST was performed and was reactive prior to procedure. NST:  EFM: Baseline: 145 bpm, Variability: Good {> 6 bpm), Accelerations: Reactive and Decelerations: Absent Toco: irregular,  every 4-6 minutes Procedure done to begin ripening of the cervix prior to admission for induction of labor. Appropriate time out taken. The patient was placed in the lithotomy position and the cervix brought into view with sterile speculum. A ring forcep was used to guide the 16F foley through the internal os of the cervix. Foley Balloon filled with 60cc of sterile water. Plug inserted into end of the foley. Foley placed on tension and taped to medial thigh.   NST:  EFM Baseline: 145 bpm good variability, deceleration noted at the time of insertion. Down to 100; with good return to baseline.  Toco: irregular, every 4-6 minutes There were no signs of tachysystole or hypertonus. All equipment was removed and accounted for. The patient tolerated the procedure well.   MDM  Foley placed for induction.   Assessment and Plan   A:  Decreased fetal movement Post dates   P:   Admit to labor and delivery for induction of labor   Byren Pankow, Harolyn Rutherford, NP 10/01/2019 4:15 PM

## 2019-10-01 NOTE — Discharge Instructions (Signed)

## 2019-10-02 ENCOUNTER — Encounter (HOSPITAL_COMMUNITY): Payer: Self-pay | Admitting: Obstetrics and Gynecology

## 2019-10-02 ENCOUNTER — Inpatient Hospital Stay (HOSPITAL_COMMUNITY): Payer: Self-pay

## 2019-10-02 ENCOUNTER — Inpatient Hospital Stay (HOSPITAL_COMMUNITY): Admission: AD | Admit: 2019-10-02 | Payer: Self-pay | Source: Home / Self Care | Admitting: Obstetrics and Gynecology

## 2019-10-02 DIAGNOSIS — Z3A40 40 weeks gestation of pregnancy: Secondary | ICD-10-CM

## 2019-10-02 DIAGNOSIS — O368131 Decreased fetal movements, third trimester, fetus 1: Secondary | ICD-10-CM

## 2019-10-02 DIAGNOSIS — O34211 Maternal care for low transverse scar from previous cesarean delivery: Secondary | ICD-10-CM

## 2019-10-02 LAB — RPR: RPR Ser Ql: NONREACTIVE

## 2019-10-02 MED ORDER — MISOPROSTOL 200 MCG PO TABS
1000.0000 ug | ORAL_TABLET | Freq: Once | ORAL | Status: AC
  Administered 2019-10-02: 1000 ug via RECTAL

## 2019-10-02 MED ORDER — ACETAMINOPHEN 325 MG PO TABS
650.0000 mg | ORAL_TABLET | ORAL | Status: DC | PRN
  Administered 2019-10-03 (×2): 650 mg via ORAL
  Filled 2019-10-02 (×2): qty 2

## 2019-10-02 MED ORDER — TETANUS-DIPHTH-ACELL PERTUSSIS 5-2.5-18.5 LF-MCG/0.5 IM SUSP: 0.5000 mL | Freq: Once | INTRAMUSCULAR | Status: DC

## 2019-10-02 MED ORDER — MISOPROSTOL 200 MCG PO TABS
ORAL_TABLET | ORAL | Status: AC
  Filled 2019-10-02: qty 5

## 2019-10-02 MED ORDER — LACTATED RINGERS AMNIOINFUSION: INTRAVENOUS | Status: DC

## 2019-10-02 MED ORDER — SIMETHICONE 80 MG PO CHEW: 80.0000 mg | CHEWABLE_TABLET | ORAL | Status: DC | PRN

## 2019-10-02 MED ORDER — SODIUM CHLORIDE 0.9% FLUSH: 3.0000 mL | Freq: Two times a day (BID) | INTRAVENOUS | Status: DC

## 2019-10-02 MED ORDER — SENNOSIDES-DOCUSATE SODIUM 8.6-50 MG PO TABS
2.0000 | ORAL_TABLET | ORAL | Status: DC
  Administered 2019-10-02 – 2019-10-04 (×2): 2 via ORAL
  Filled 2019-10-02 (×2): qty 2

## 2019-10-02 MED ORDER — COCONUT OIL OIL: 1.0000 "application " | TOPICAL_OIL | Status: DC | PRN

## 2019-10-02 MED ORDER — IBUPROFEN 600 MG PO TABS
600.0000 mg | ORAL_TABLET | Freq: Four times a day (QID) | ORAL | Status: DC
  Administered 2019-10-02 – 2019-10-04 (×9): 600 mg via ORAL
  Filled 2019-10-02 (×9): qty 1

## 2019-10-02 MED ORDER — ONDANSETRON HCL 4 MG PO TABS: 4.0000 mg | ORAL_TABLET | ORAL | Status: DC | PRN

## 2019-10-02 MED ORDER — MEASLES, MUMPS & RUBELLA VAC IJ SOLR: 0.5000 mL | Freq: Once | INTRAMUSCULAR | Status: DC

## 2019-10-02 MED ORDER — METHYLERGONOVINE MALEATE 0.2 MG/ML IJ SOLN: 0.2000 mg | Freq: Once | INTRAMUSCULAR | Status: DC

## 2019-10-02 MED ORDER — SODIUM CHLORIDE 0.9 % IV SOLN: 250.0000 mL | INTRAVENOUS | Status: DC | PRN

## 2019-10-02 MED ORDER — DIPHENHYDRAMINE HCL 25 MG PO CAPS: 25.0000 mg | ORAL_CAPSULE | Freq: Four times a day (QID) | ORAL | Status: DC | PRN

## 2019-10-02 MED ORDER — PRENATAL MULTIVITAMIN CH
1.0000 | ORAL_TABLET | Freq: Every day | ORAL | Status: DC
  Administered 2019-10-03 – 2019-10-04 (×2): 1 via ORAL
  Filled 2019-10-02 (×2): qty 1

## 2019-10-02 MED ORDER — ZOLPIDEM TARTRATE 5 MG PO TABS: 5.0000 mg | ORAL_TABLET | Freq: Every evening | ORAL | Status: DC | PRN

## 2019-10-02 MED ORDER — BENZOCAINE-MENTHOL 20-0.5 % EX AERO
1.0000 "application " | INHALATION_SPRAY | CUTANEOUS | Status: DC | PRN
  Administered 2019-10-03: 1 via TOPICAL
  Filled 2019-10-02: qty 56

## 2019-10-02 MED ORDER — DIBUCAINE (PERIANAL) 1 % EX OINT: 1.0000 "application " | TOPICAL_OINTMENT | CUTANEOUS | Status: DC | PRN

## 2019-10-02 MED ORDER — ONDANSETRON HCL 4 MG/2ML IJ SOLN: 4.0000 mg | INTRAMUSCULAR | Status: DC | PRN

## 2019-10-02 MED ORDER — SODIUM CHLORIDE 0.9% FLUSH: 3.0000 mL | INTRAVENOUS | Status: DC | PRN

## 2019-10-02 MED ORDER — WITCH HAZEL-GLYCERIN EX PADS: 1.0000 "application " | MEDICATED_PAD | CUTANEOUS | Status: DC | PRN

## 2019-10-02 NOTE — Progress Notes (Signed)
Kathleen Estrada is a 29 y.o. G4P1021 at [redacted]w[redacted]d by LMP admitted for induction of labor due to decreased fetal movement post term.  Subjective: Pt comfortable with epidural, s/o at bedside for support.  Objective: BP (!) 91/57   Pulse 73   Temp 98.9 F (37.2 C) (Oral)   Resp 16   Wt 74.8 kg   LMP 12/19/2018 (Exact Date)   BMI 30.18 kg/m  No intake/output data recorded. Total I/O In: -  Out: 725 [Urine:725]  FHT:  FHR: 140 bpm, variability: moderate,  accelerations:  Abscent,  decelerations:  Present repetitive variables with contractions UC:   regular, every 3 minutes SVE:   Dilation: 6 Effacement (%): 80 Station: -2 Exam by:: Misty Stanley L. CNM  Labs: Lab Results  Component Value Date   WBC 9.5 10/01/2019   HGB 13.4 10/01/2019   HCT 40.7 10/01/2019   MCV 94.4 10/01/2019   PLT 289 10/01/2019    Assessment / Plan: Induction of labor due to decreased fetal movement S/P foley bulb  Labor: labor progressing normally but Category 2 FHR tracing with variables.  CNM called to room x2-3 times for decelerations. Initially, IUPC removed as decels started after AROM and IUPC placement and blood was noted in IUPC catheter. FSE was also placed by CNM. Decelerations improved for a short time with these changes plus maternal positioning and IV fluid bolus.  Then, returned to room with variables continuing and second IUPC placed without difficulty with only clear fluid noted in catheter.  Amnioinfusion started with 300 ml bolus, then 125 ml/hour.  Variables resolved with start of amnioinfusion.  Moderate variability with some accels noted. Positive scalp stimulation.  Will continue to monitor.  Preeclampsia:  n/a Fetal Wellbeing:  Category II Pain Control:  Epidural I/D:  GBS neg Anticipated MOD:  NSVD  Sharen Counter 10/02/2019, 5:22 AM

## 2019-10-02 NOTE — Progress Notes (Signed)
Adonia Porada is a 29 y.o. G4P1021 at [redacted]w[redacted]d by LMP admitted for induction of labor due to decreased fetal movement post term.  Subjective: Pt comfortable with epidural, s/o in room for support.  Objective: BP (!) 91/57   Pulse 73   Temp 98.9 F (37.2 C) (Oral)   Resp 16   Wt 74.8 kg   LMP 12/19/2018 (Exact Date)   BMI 30.18 kg/m  No intake/output data recorded. Total I/O In: -  Out: 725 [Urine:725]  FHT:  FHR: 135 bpm, variability: moderate,  accelerations:  Present,  decelerations:  Present variables UC:   regular, every 3 minutes SVE:   Dilation: 6 Effacement (%): 80 Station: -2 Exam by:: Bettylou Frew L. CNM AROM with moderate meconium, IUPC placed without difficulty. Pt tolerated well. Labs: Lab Results  Component Value Date   WBC 9.5 10/01/2019   HGB 13.4 10/01/2019   HCT 40.7 10/01/2019   MCV 94.4 10/01/2019   PLT 289 10/01/2019    Assessment / Plan: Induction of labor due to decreased fetal movement S/P Foley bulb  Labor: Progressing normally Preeclampsia:  n/a Fetal Wellbeing:  Category II Pain Control:  Epidural I/D:  GBS neg Anticipated MOD:  VBAC  Chelse Matas Leftwich-Kirby 10/02/2019, 4:22 AM

## 2019-10-03 MED ORDER — OXYCODONE HCL 5 MG PO TABS: 5.0000 mg | ORAL_TABLET | ORAL | Status: DC | PRN

## 2019-10-03 NOTE — Progress Notes (Signed)
Post Partum Day 1 Subjective: up ad lib, voiding, tolerating PO and abdominal and back pain  Objective: Blood pressure 91/71, pulse 76, temperature 99.4 F (37.4 C), temperature source Oral, resp. rate 18, weight 74.8 kg, last menstrual period 12/19/2018, SpO2 99 %, unknown if currently breastfeeding.  Physical Exam:  General: alert, cooperative and no distress Lochia: appropriate Uterine Fundus: soft, 1 above umbilicus Incision: n/a DVT Evaluation: No evidence of DVT seen on physical exam.  Recent Labs    10/01/19 1458  HGB 13.4  HCT 40.7    Assessment/Plan: Plan for discharge tomorrow, Breastfeeding and Contraception OCPs  Pt encourage to keep bladder empty, notify RN if increased bleeding or pain not controlled with medications.   LOS: 2 days   Sharen Counter 10/03/2019, 8:17 AM

## 2019-10-04 DIAGNOSIS — O34219 Maternal care for unspecified type scar from previous cesarean delivery: Secondary | ICD-10-CM

## 2019-10-04 MED ORDER — ACETAMINOPHEN 325 MG PO TABS: 325.0000 mg | ORAL_TABLET | Freq: Four times a day (QID) | ORAL | Status: AC | PRN

## 2019-10-04 MED ORDER — IBUPROFEN 600 MG PO TABS
600.0000 mg | ORAL_TABLET | Freq: Three times a day (TID) | ORAL | 0 refills | Status: DC
Start: 2019-10-04 — End: 2021-04-07

## 2019-10-04 MED ORDER — NORETHINDRONE 0.35 MG PO TABS: 1.0000 | ORAL_TABLET | Freq: Every day | ORAL | 2 refills | Status: DC

## 2019-10-04 MED ORDER — COCONUT OIL OIL: 1.0000 "application " | TOPICAL_OIL | 0 refills | Status: AC | PRN

## 2019-10-04 NOTE — Anesthesia Postprocedure Evaluation (Signed)
Anesthesia Post Note  Patient: Kathleen Estrada  Procedure(s) Performed: AN AD HOC LABOR EPIDURAL     Patient location during evaluation: Other Anesthesia Type: Epidural Level of consciousness: awake and alert Pain management: pain level controlled Respiratory status: spontaneous breathing, nonlabored ventilation and respiratory function stable Cardiovascular status: stable Postop Assessment: no headache and no backache Anesthetic complications: no   No complications documented.  Last Vitals:  Vitals:   10/03/19 2046 10/04/19 0613  BP: 115/72 101/67  Pulse: 74 71  Resp: 18 18  Temp: 36.8 C 36.8 C  SpO2:      Last Pain:  Vitals:   10/04/19 1200  TempSrc:   PainSc: 0-No pain   Pain Goal: Patients Stated Pain Goal: 2 (10/03/19 1649)                 [facility]

## 2019-10-04 NOTE — Discharge Summary (Signed)
Postpartum Discharge Summary    Patient Name: Kathleen Estrada DOB: Jan 01, 1991 MRN: [id number]  Date of admission: 10/01/2019 Delivery date:10/02/2019  Delivering provider: CONSTANT, PEGGY  Date of discharge: 10/04/2019  Admitting diagnosis: Post term pregnancy at [redacted] weeks gestation [O48.0, Z3A.41] Intrauterine pregnancy: [redacted]w[redacted]d    Secondary diagnosis:  Active Problems:   Post term pregnancy at 415weeks gestation   Vaginal birth after cesarean (VBAC)  Additional problems: as noted above   Discharge diagnosis: VBAC                                              Post partum procedures:none Augmentation: AROM and OP Foley Complications: None  Hospital course: Induction of Labor With Vaginal Delivery   29y.o. yo [id number] [id number] admitted to the hospital 10/01/2019 for induction of labor.  Indication for induction: decreased fetal movement, post-dates.  Patient had an uncomplicated labor course as follows: Membrane Rupture Time/Date: 3:58 AM ,10/02/2019   Delivery Method:VBAC, Vacuum Assisted  Episiotomy: None  Lacerations:  2nd degree;Perineal  Details of delivery can be found in separate delivery note.  Patient had a routine postpartum course. Patient is discharged home 10/04/19.  Newborn Data: Birth date:10/02/2019  Birth time:10:45 AM  Gender:Female  Living status:Living  Apgars:8 ,9  Weight:3215 g   Magnesium Sulfate received: No BMZ received: No Rhophylac:N/A MMR:N/A T-DaP:Given prenatally Flu: Yes Transfusion:No  Physical exam  Vitals:   10/03/19 0500 10/03/19 1453 10/03/19 2046 10/04/19 0613  BP: 91/71 102/65 115/72 101/67  Pulse: 76 84 74 71  Resp: '18 18 18 18  ' Temp: 99.4 F (37.4 C) 98.4 F (36.9 C) 98.2 F (36.8 C) 98.3 F (36.8 C)  TempSrc: Oral Oral Oral Oral  SpO2: 99% 100%    Weight:       General: alert, cooperative and no distress Lochia: appropriate Uterine Fundus: firm Incision: N/A DVT Evaluation: No evidence of DVT seen on physical exam. No  cords or calf tenderness. No significant calf/ankle edema. Labs: Lab Results  Component Value Date   WBC 9.5 10/01/2019   HGB 13.4 10/01/2019   HCT 40.7 10/01/2019   MCV 94.4 10/01/2019   PLT 289 10/01/2019   No flowsheet data found. Edinburgh Score: Edinburgh Postnatal Depression Scale Screening Tool 10/03/2019  I have been able to laugh and see the funny side of things. 1  I have looked forward with enjoyment to things. 0  I have blamed myself unnecessarily when things went wrong. 0  I have been anxious or worried for no good reason. 1  I have felt scared or panicky for no good reason. 0  Things have been getting on top of me. 1  I have been so unhappy that I have had difficulty sleeping. 0  I have felt sad or miserable. 0  I have been so unhappy that I have been crying. 0  The thought of harming myself has occurred to me. 0  Edinburgh Postnatal Depression Scale Total 3     After visit meds:  Allergies as of 10/04/2019   No Known Allergies     Medication List    TAKE these medications   acetaminophen 325 MG tablet Commonly known as: TYLENOL Take 1 tablet (325 mg total) by mouth every 6 (six) hours as needed for mild pain or headache.   coconut oil Oil Apply 1 application topically as  needed (nipple pain).   ibuprofen 600 MG tablet Commonly known as: ADVIL Take 1 tablet (600 mg total) by mouth every 8 (eight) hours.   norethindrone 0.35 MG tablet Commonly known as: Ortho Micronor Take 1 tablet (0.35 mg total) by mouth daily.   PRENATAL VITAMIN PO Take 1 tablet by mouth daily.        Discharge home in stable condition Infant Feeding: Breast Infant Disposition:home with mother Discharge instruction: per After Visit Summary and Postpartum booklet. Activity: Advance as tolerated. Pelvic rest for 6 weeks.  Diet: routine diet Future Appointments:No future appointments. Follow up Visit:  Please schedule this patient for a In person postpartum visit in 4 weeks  with the following provider: Any provider. Additional Postpartum F/U:none  High risk pregnancy complicated by: H/o CS Delivery mode:  VBAC, Vacuum Assisted  Anticipated Birth Control:  POPs  *Instructed pt to schedule postpartum appt '@[facility]'   Astrid Drafts, Gildardo Cranker, MD OB Fellow, Faculty [facility] 10/04/2019 2:30 PM

## 2020-03-17 ENCOUNTER — Ambulatory Visit: Payer: Self-pay

## 2020-08-03 IMAGING — US US OB TRANSVAGINAL
1 series · 15 of 28 positions shown · non-contrast
Comparison: None.

CLINICAL DATA: Vaginal bleeding

EXAM:
TRANSVAGINAL OB ULTRASOUND
TECHNIQUE: Transvaginal ultrasound was performed for complete evaluation of the
gestation as well as the maternal uterus, adnexal regions, and
pelvic cul-de-sac.

[Series 1: us ob transvaginal · 15 of 36 slices shown]
[im 1/36]
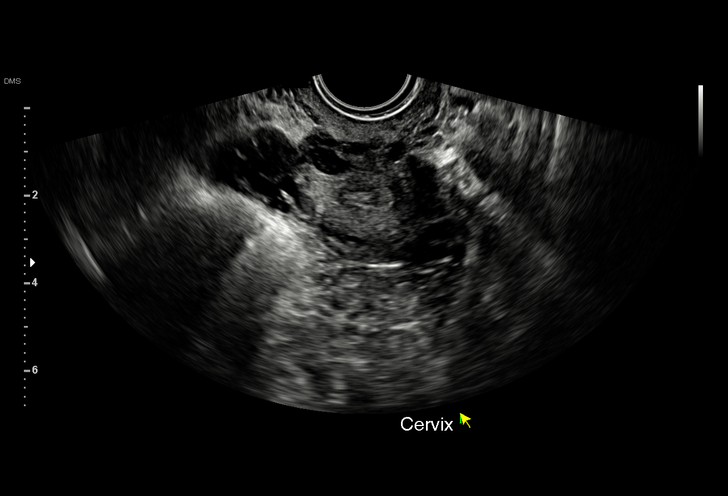
[im 3/36]
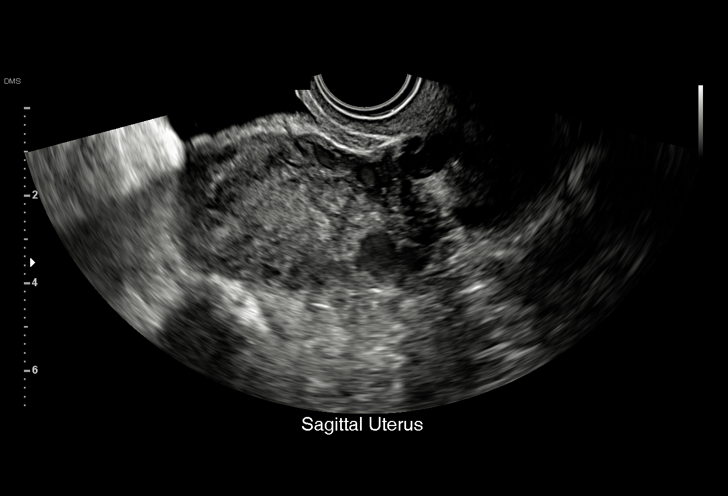
[im 6/36]
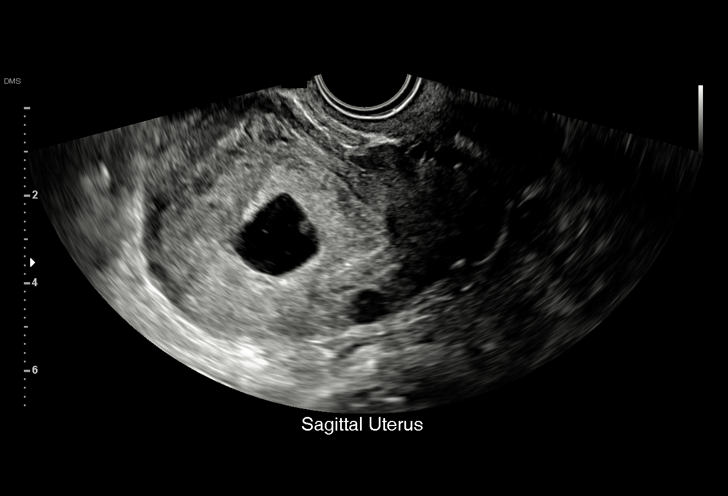
[im 8/36]
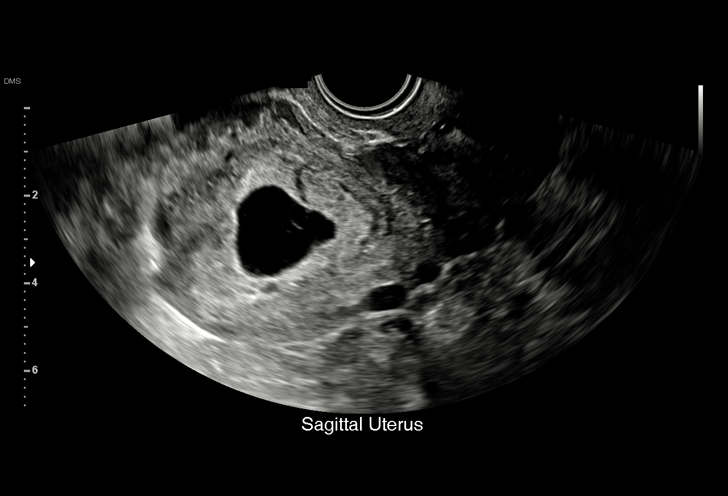
[im 11/36]
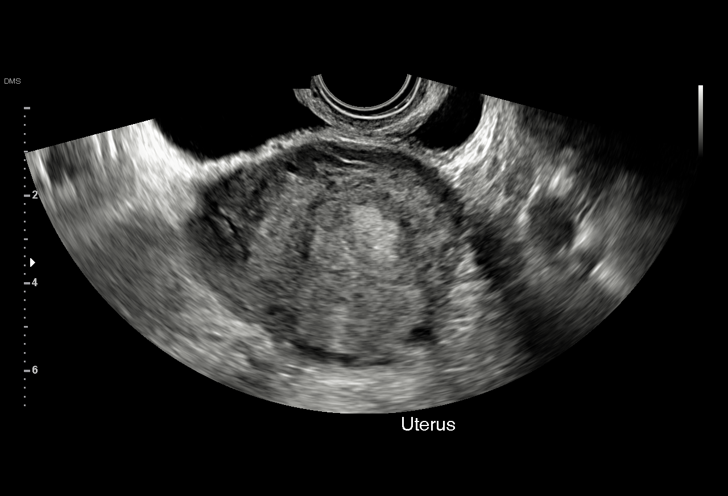
[im 13/36]
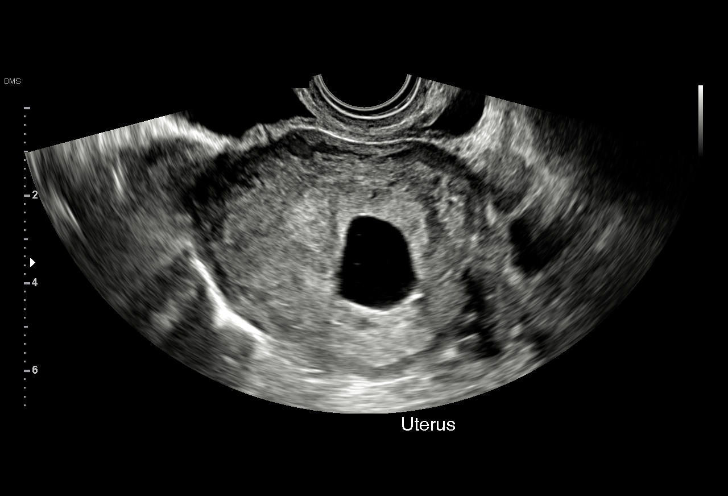
[im 16/36]
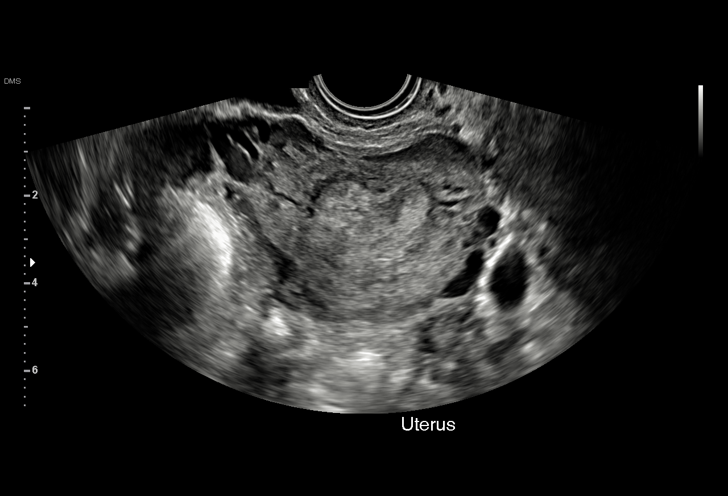
[im 19/36]
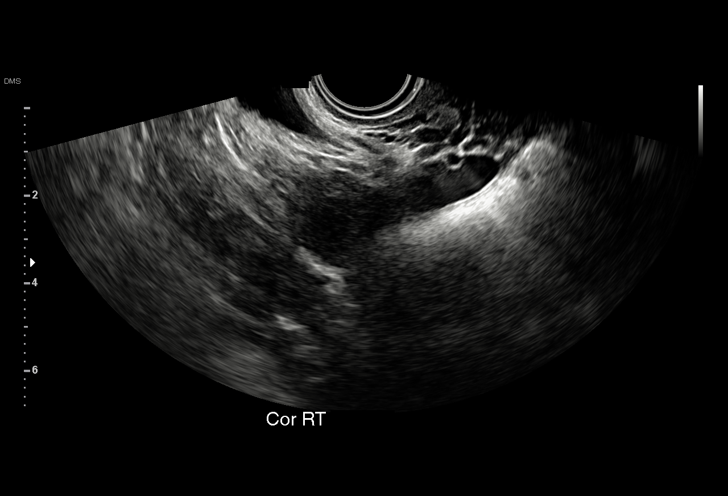
[im 20/36]
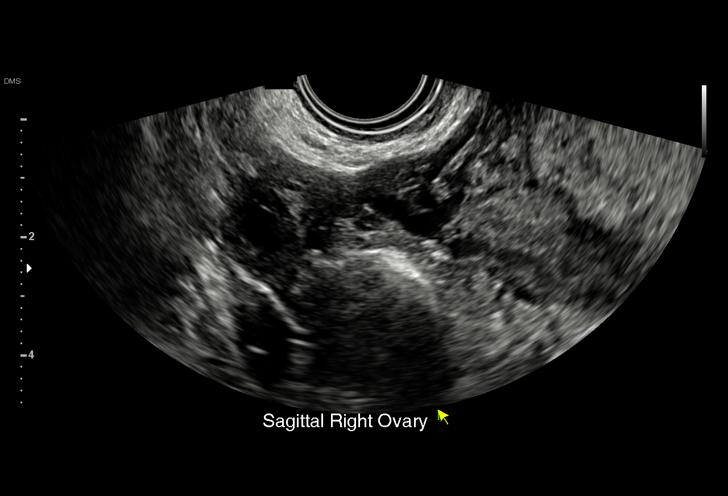
[im 23/36]
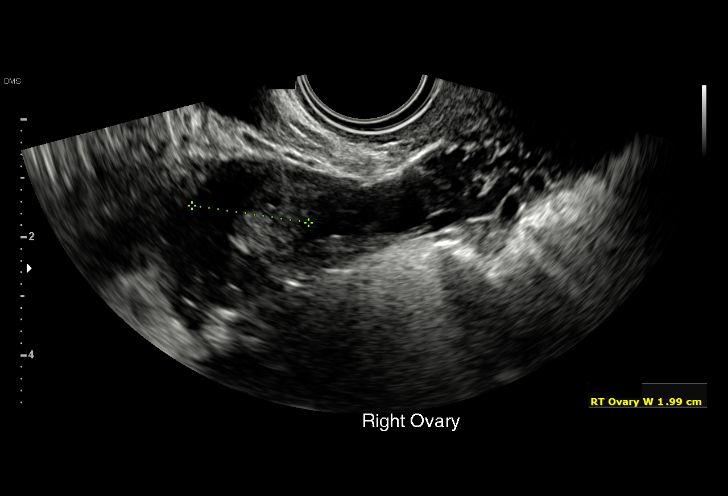
[im 25/36]
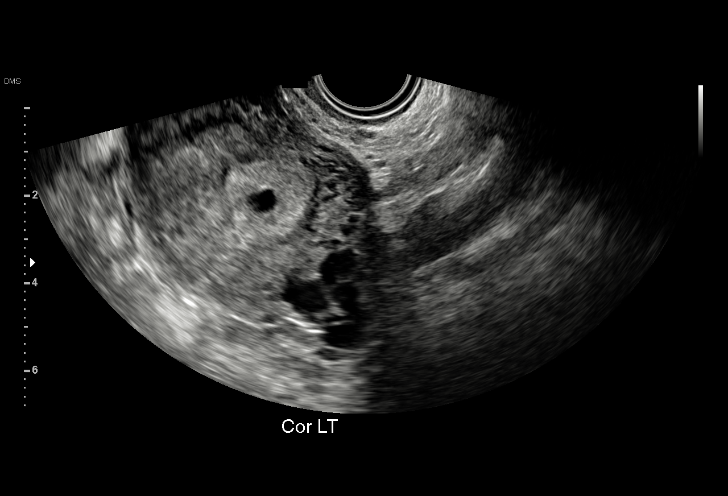
[im 28/36]
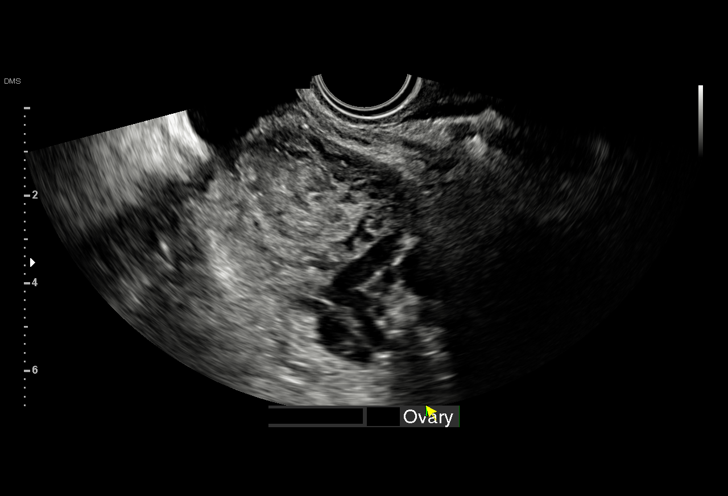
[im 30/36]
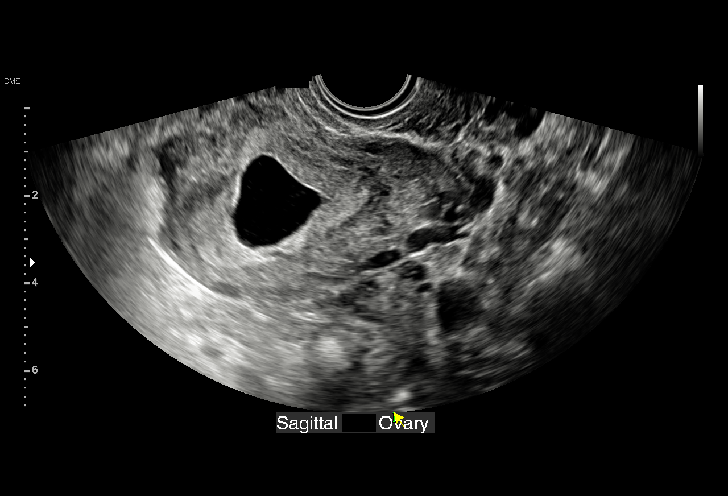
[im 33/36]
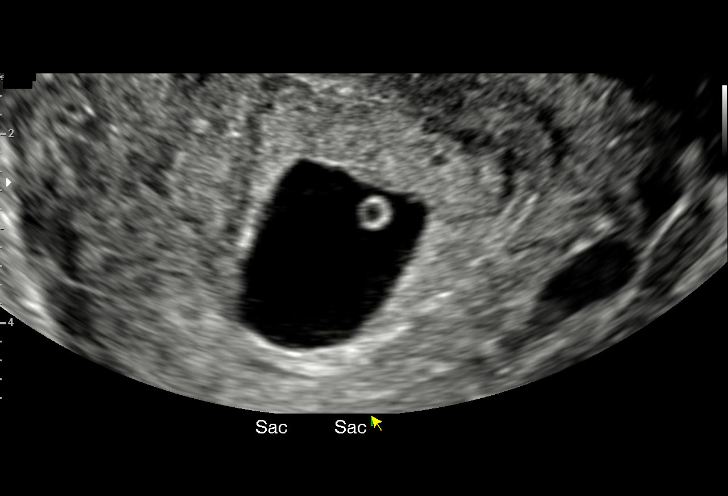
[im 36/36]
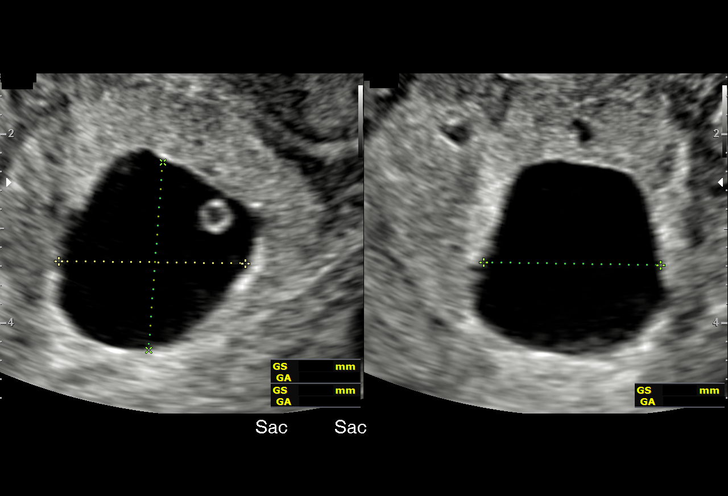

[15 of 28 positions shown; findings below may reference images not displayed]

FINDINGS: Intrauterine gestational sac: Single

Yolk sac:  Visualized

Embryo:  Not visualized

Cardiac Activity: Not visualized

Heart Rate:  bpm

MSD: 19.4 mm   6 w   6 d

CRL:     mm    w  d                  US EDC:

Subchorionic hemorrhage:  None visualized.

Maternal uterus/adnexae: No adnexal mass or free fluid.
IMPRESSION: Intrauterine gestational sac with yolk sac but no fetal pole, 6
weeks 6 days by mean sac diameter. Findings are suspicious but not
yet definitive for failed pregnancy. Recommend follow-up US in 10-14
days for definitive diagnosis. This recommendation follows SRU
consensus guidelines: Diagnostic Criteria for Nonviable Pregnancy
Early in the First Trimester. N Engl J Med 9013; [DATE].

## 2020-08-12 IMAGING — US US OB TRANSVAGINAL
1 series · 15 of 28 positions shown · non-contrast
Comparison: Pelvic ultrasound 01/15/2018; pelvic ultrasound
01/10/2018

CLINICAL DATA: Pregnant patient with vaginal bleeding.

EXAM:
TRANSVAGINAL OB ULTRASOUND
TECHNIQUE: Transvaginal ultrasound was performed for complete evaluation of the
gestation as well as the maternal uterus, adnexal regions, and
pelvic cul-de-sac.

[Series 1: us ob transvaginal · 37 acquisitions, 15 frames shown]
[im 1/37]
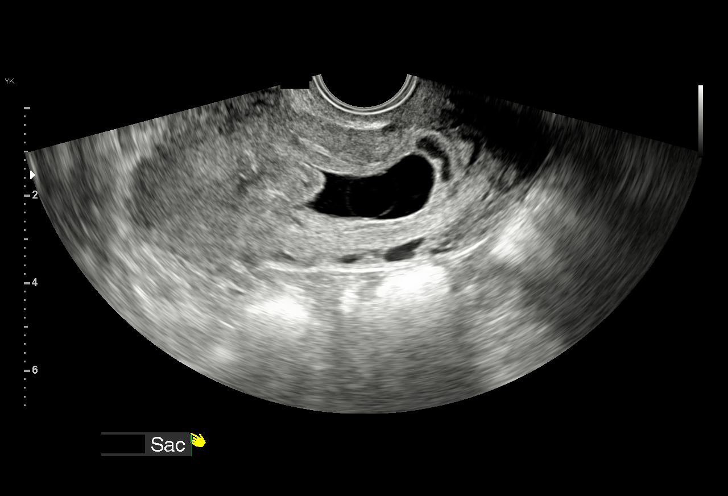
[im 3/37]
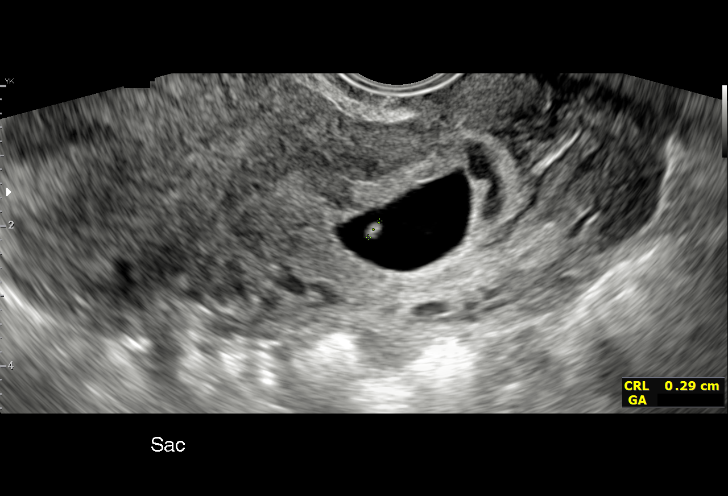
[im 6/37]
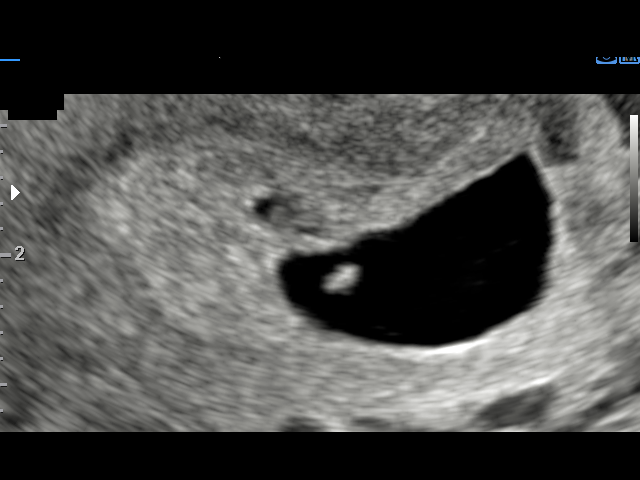
[im 9/37]
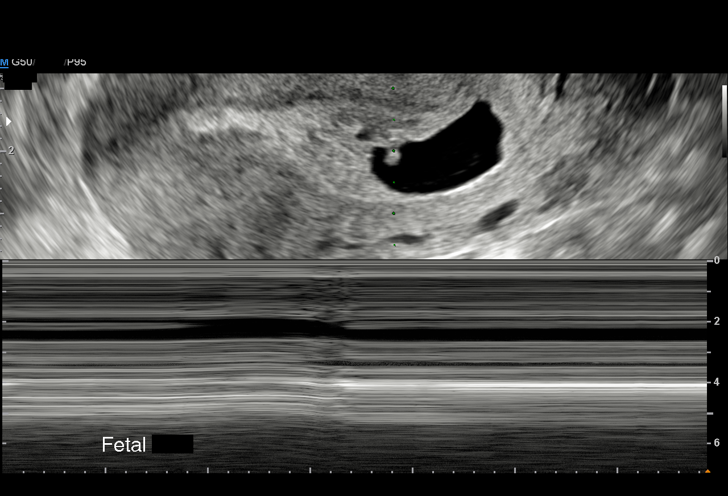
[im 11/37]
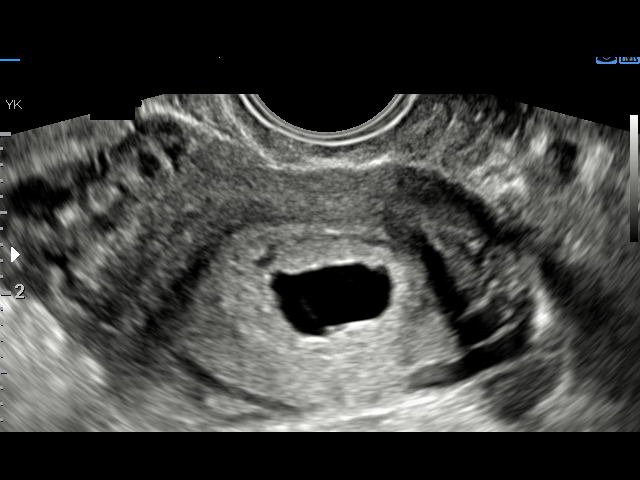
[im 14/37]
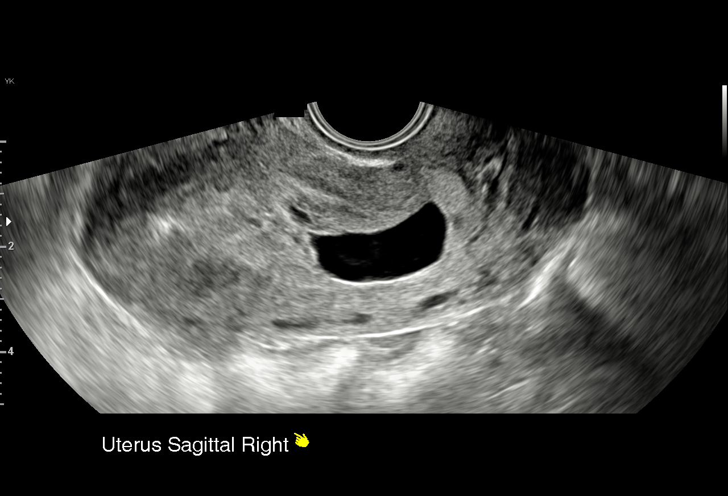
[im 17/37]
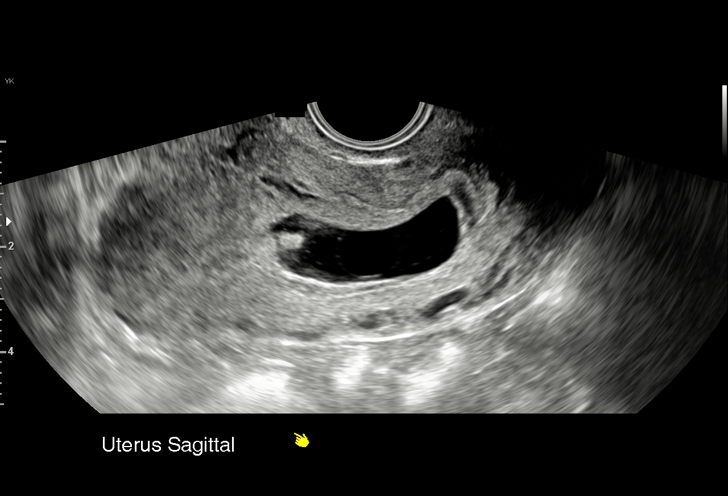
[im 19/37]
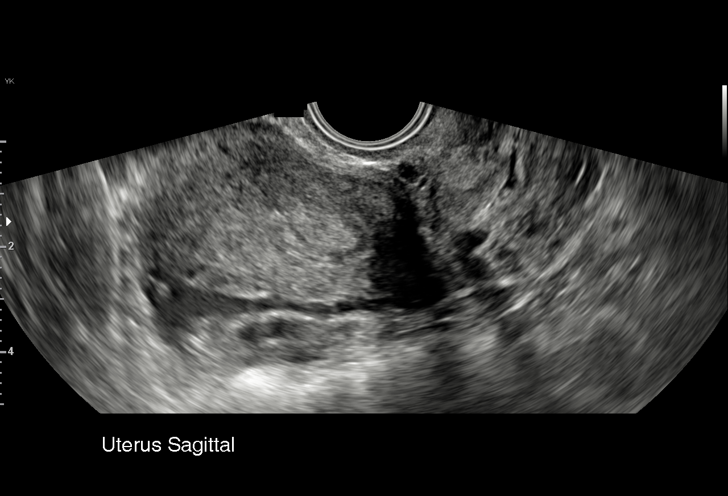
[im 21/37]
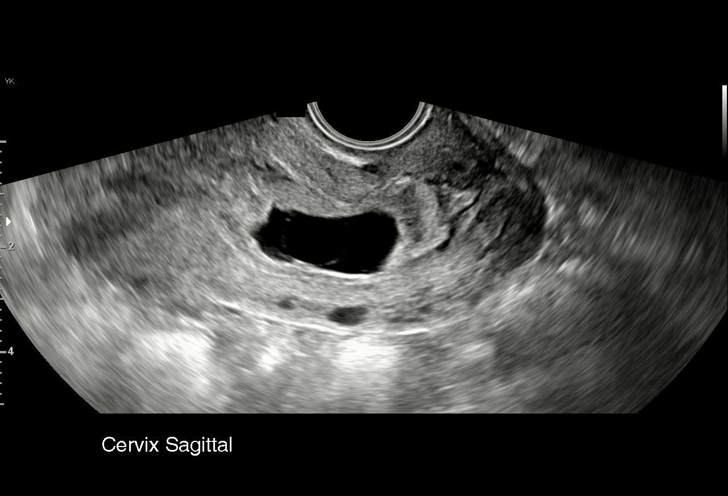
[im 23/37]
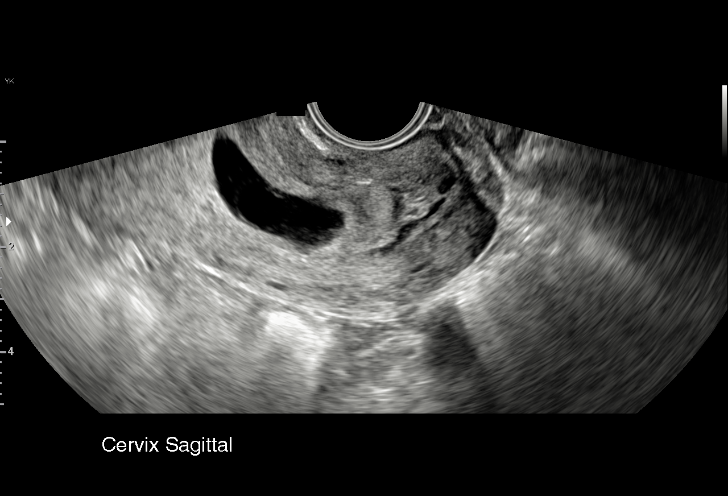
[im 26/37]
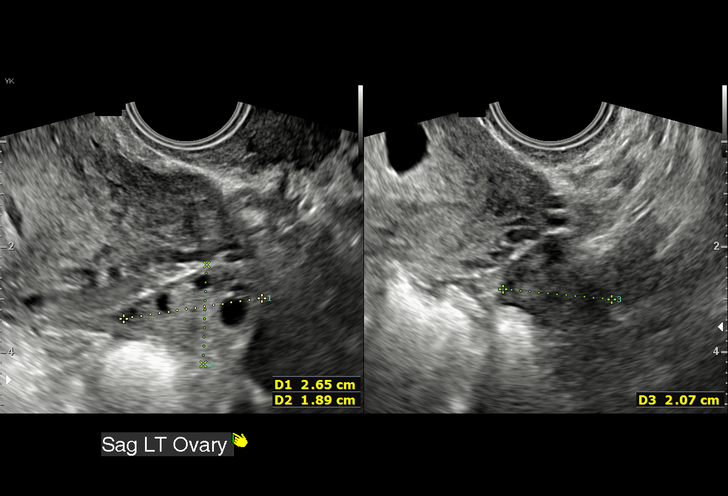
[im 29/37]
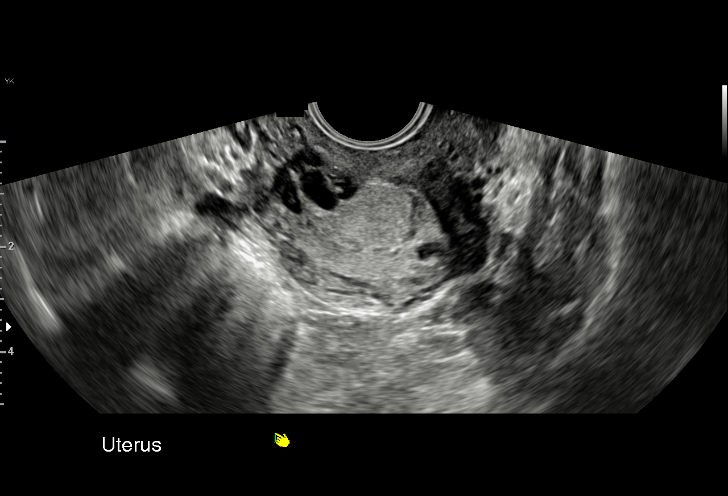
[im 31/37]
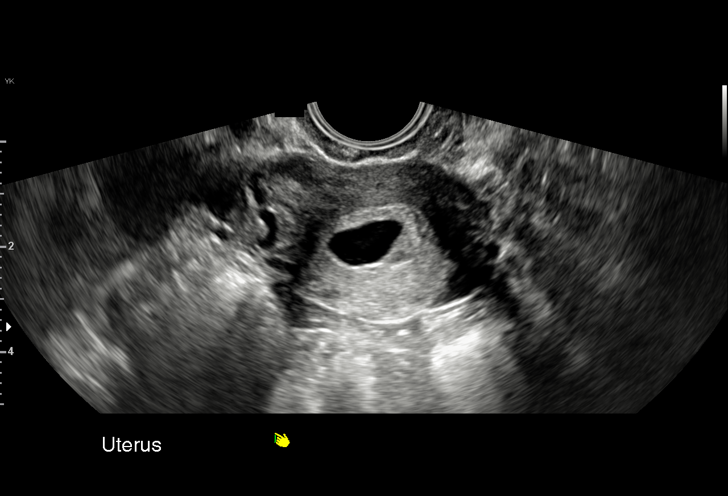
[im 34/37]
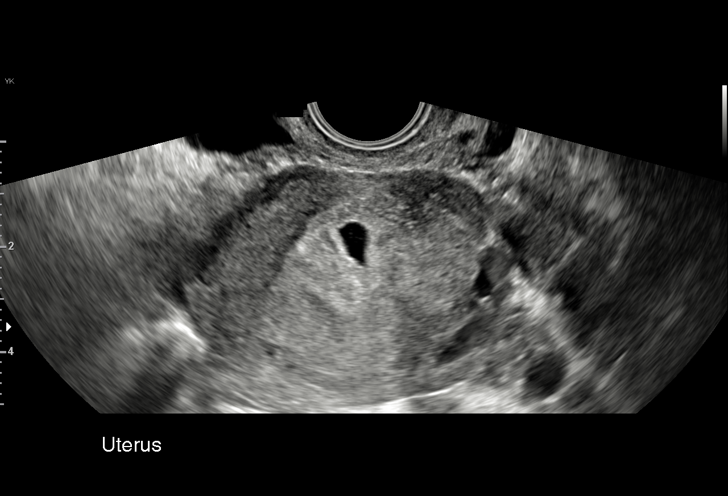
[im 37/37]
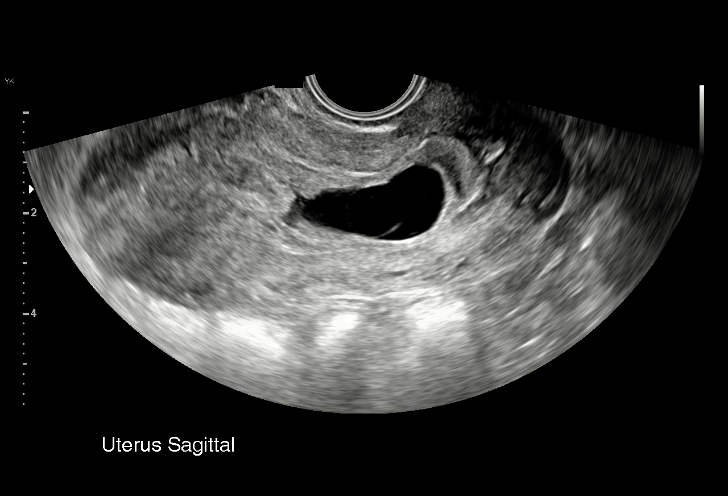

[15 of 28 positions shown; findings below may reference images not displayed]

FINDINGS: Intrauterine gestational sac: Single

Yolk sac:  Not Visualized.

Embryo:  Visualized.

Cardiac Activity: Not Visualized.

Heart Rate: 0 bpm

CRL:   2.9 mm   5 w 5 d                  US EDC: 09/21/2018

Subchorionic hemorrhage:  None visualized.

Maternal uterus/adnexae: Normal right and left ovaries. No free
fluid in the pelvis.
IMPRESSION: Intrauterine gestational sac and fetal pole are demonstrated. No
fetal heartbeat identified. Findings meet definitive criteria for
failed pregnancy. This follows SRU consensus guidelines: Diagnostic
Criteria for Nonviable Pregnancy Early in the First Trimester. N
Engl J Med 8682;[DATE].

## 2021-04-07 ENCOUNTER — Inpatient Hospital Stay (HOSPITAL_COMMUNITY)
Admission: AD | Admit: 2021-04-07 | Discharge: 2021-04-07 | Disposition: A | Discharge status: Home / Self Care | Payer: Medicaid Other | Attending: Obstetrics & Gynecology | Admitting: Obstetrics & Gynecology

## 2021-04-07 ENCOUNTER — Encounter (HOSPITAL_COMMUNITY): Payer: Self-pay | Admitting: *Deleted

## 2021-04-07 ENCOUNTER — Inpatient Hospital Stay (HOSPITAL_COMMUNITY): Payer: Self-pay

## 2021-04-07 DIAGNOSIS — O99331 Smoking (tobacco) complicating pregnancy, first trimester: Secondary | ICD-10-CM | POA: Diagnosis not present

## 2021-04-07 DIAGNOSIS — O021 Missed abortion: Secondary | ICD-10-CM | POA: Diagnosis not present

## 2021-04-07 DIAGNOSIS — Z3A12 12 weeks gestation of pregnancy: Secondary | ICD-10-CM | POA: Insufficient documentation

## 2021-04-07 DIAGNOSIS — O209 Hemorrhage in early pregnancy, unspecified: Secondary | ICD-10-CM | POA: Diagnosis present

## 2021-04-07 DIAGNOSIS — O4690 Antepartum hemorrhage, unspecified, unspecified trimester: Secondary | ICD-10-CM

## 2021-04-07 LAB — URINALYSIS, ROUTINE W REFLEX MICROSCOPIC
Bacteria, UA: NONE SEEN
Bilirubin Urine: NEGATIVE
Glucose, UA: NEGATIVE mg/dL
Ketones, ur: NEGATIVE mg/dL
Nitrite: NEGATIVE
Protein, ur: NEGATIVE mg/dL
Specific Gravity, Urine: 1.017 (ref 1.005–1.030)
pH: 7 (ref 5.0–8.0)

## 2021-04-07 LAB — CBC
HCT: 38.4 % (ref 36.0–46.0)
Hemoglobin: 13.3 g/dL (ref 12.0–15.0)
MCH: 32 pg (ref 26.0–34.0)
MCHC: 34.6 g/dL (ref 30.0–36.0)
MCV: 92.5 fL (ref 80.0–100.0)
Platelets: 266 10*3/uL (ref 150–400)
RBC: 4.15 MIL/uL (ref 3.87–5.11)
RDW: 11.9 % (ref 11.5–15.5)
WBC: 7.1 10*3/uL (ref 4.0–10.5)
nRBC: 0 % (ref 0.0–0.2)

## 2021-04-07 LAB — COMPREHENSIVE METABOLIC PANEL
ALT: 18 U/L (ref 0–44)
AST: 19 U/L (ref 15–41)
Albumin: 3.6 g/dL (ref 3.5–5.0)
Alkaline Phosphatase: 59 U/L (ref 38–126)
Anion gap: 8 (ref 5–15)
BUN: 11 mg/dL (ref 6–20)
CO2: 21 mmol/L — ABNORMAL LOW (ref 22–32)
Calcium: 8.7 mg/dL — ABNORMAL LOW (ref 8.9–10.3)
Chloride: 107 mmol/L (ref 98–111)
Creatinine, Ser: 0.58 mg/dL (ref 0.44–1.00)
GFR, Estimated: >60 mL/min (ref >60)
Glucose, Bld: 90 mg/dL (ref 70–99)
Potassium: 3.7 mmol/L (ref 3.5–5.1)
Sodium: 136 mmol/L (ref 135–145)
Total Bilirubin: 0.4 mg/dL (ref 0.3–1.2)
Total Protein: 6.8 g/dL (ref 6.5–8.1)

## 2021-04-07 LAB — WET PREP, GENITAL
Clue Cells Wet Prep HPF POC: NONE SEEN
Sperm: NONE SEEN
Trich, Wet Prep: NONE SEEN
WBC, Wet Prep HPF POC: >=10 — AB (ref <10)
Yeast Wet Prep HPF POC: NONE SEEN

## 2021-04-07 LAB — HCG, QUANTITATIVE, PREGNANCY: hCG, Beta Chain, Quant, S: 2602 m[IU]/mL — ABNORMAL HIGH (ref <5)

## 2021-04-07 LAB — ABO/RH: ABO/RH(D): O POS

## 2021-04-07 LAB — POCT PREGNANCY, URINE: Preg Test, Ur: POSITIVE — AB

## 2021-04-07 NOTE — MAU Provider Note (Signed)
?History  ?  ? ?CSN: [id number] ? ?Arrival date and time: 04/07/21 1241 ? ? None  ?  ? ?Chief Complaint  ?Patient presents with  ? Vaginal Bleeding  ? ?Kathleen Estrada is a 31 y.o. [id number] at [redacted]w[redacted]d by Definite LMP of Jan 10, 2021.  She presents  today for Vaginal Bleeding.  She reports passing small clots and notes the bleeding with wiping only.  She states it initially started as dark brown and progressed to bright red today.  She states she did not have any discharge prior to the bleeding. She denies recent sexual activity and has no pain or cramping.  ? ? ?OB History   ? ? Gravida  ?5  ? Para  ?2  ? Term  ?2  ? Preterm  ?   ? AB  ?2  ? Living  ?2  ?  ? ? SAB  ?2  ? IAB  ?   ? Ectopic  ?   ? Multiple  ?0  ? Live Births  ?2  ?   ?  ?  ? ? ?Past Medical History:  ?Diagnosis Date  ? Medical history non-contributory   ? ? ?Past Surgical History:  ?Procedure Laterality Date  ? CESAREAN SECTION  2011  ? ? ?No family history on file. ? ?Social History  ? ?Tobacco Use  ? Smoking status: Never  ? Smokeless tobacco: Never  ?Vaping Use  ? Vaping Use: Never used  ?Substance Use Topics  ? Alcohol use: No  ? Drug use: No  ? ? ?Allergies: No Known Allergies ? ?Medications Prior to Admission  ?Medication Sig Dispense Refill Last Dose  ? acetaminophen (TYLENOL) 325 MG tablet Take 1 tablet (325 mg total) by mouth every 6 (six) hours as needed for mild pain or headache.     ? coconut oil OIL Apply 1 application topically as needed (nipple pain).  0   ? ibuprofen (ADVIL) 600 MG tablet Take 1 tablet (600 mg total) by mouth every 8 (eight) hours. 30 tablet 0   ? norethindrone (ORTHO MICRONOR) 0.35 MG tablet Take 1 tablet (0.35 mg total) by mouth daily. 28 tablet 2   ? Prenatal Vit-Fe Fumarate-FA (PRENATAL VITAMIN PO) Take 1 tablet by mouth daily.      ? ? ?Review of Systems  ?Gastrointestinal:  Positive for diarrhea. Negative for abdominal pain, constipation, nausea and vomiting.  ?Genitourinary:  Positive for vaginal bleeding.  Negative for difficulty urinating, dysuria, pelvic pain and vaginal discharge.  ?Neurological:  Negative for dizziness, light-headedness and headaches.  ?Physical Exam  ? ?Blood pressure 116/72, pulse 80, temperature 98.1 ?F (36.7 ?C), temperature source Oral, resp. rate 17, height 5\' 2"  (1.575 m), weight 61.9 kg, last menstrual period 01/10/2021, SpO2 100 %, unknown if currently breastfeeding. ? ?Physical Exam ?Vitals reviewed.  ?Constitutional:   ?   Appearance: Normal appearance.  ?HENT:  ?   Head: Normocephalic and atraumatic.  ?Eyes:  ?   Conjunctiva/sclera: Conjunctivae normal.  ?Cardiovascular:  ?   Rate and Rhythm: Normal rate.  ?Pulmonary:  ?   Effort: Pulmonary effort is normal. No respiratory distress.  ?Musculoskeletal:     ?   General: Normal range of motion.  ?   Cervical back: Normal range of motion.  ?Neurological:  ?   Mental Status: She is alert and oriented to person, place, and time.  ?Psychiatric:     ?   Mood and Affect: Mood normal.     ?   Behavior:  Behavior normal.     ?   Thought Content: Thought content normal.  ? ? ?MAU Course  ?Procedures ?Results for orders placed or performed during the hospital encounter of 04/07/21 (from the past 24 hour(s))  ?Pregnancy, urine POC     Status: Abnormal  ? Collection Time: 04/07/21  1:20 PM  ?Result Value Ref Range  ? Preg Test, Ur POSITIVE (A) NEGATIVE  ?Urinalysis, Routine w reflex microscopic Urine, Clean Catch     Status: Abnormal  ? Collection Time: 04/07/21  1:27 PM  ?Result Value Ref Range  ? Color, Urine YELLOW YELLOW  ? APPearance CLEAR CLEAR  ? Specific Gravity, Urine 1.017 1.005 - 1.030  ? pH 7.0 5.0 - 8.0  ? Glucose, UA NEGATIVE NEGATIVE mg/dL  ? Hgb urine dipstick LARGE (A) NEGATIVE  ? Bilirubin Urine NEGATIVE NEGATIVE  ? Ketones, ur NEGATIVE NEGATIVE mg/dL  ? Protein, ur NEGATIVE NEGATIVE mg/dL  ? Nitrite NEGATIVE NEGATIVE  ? Leukocytes,Ua TRACE (A) NEGATIVE  ? RBC / HPF 21-50 0 - 5 RBC/hpf  ? WBC, UA 0-5 0 - 5 WBC/hpf  ? Bacteria, UA  NONE SEEN NONE SEEN  ? Squamous Epithelial / LPF 0-5 0 - 5  ? Mucus PRESENT   ?CBC     Status: None  ? Collection Time: 04/07/21  1:35 PM  ?Result Value Ref Range  ? WBC 7.1 4.0 - 10.5 K/uL  ? RBC 4.15 3.87 - 5.11 MIL/uL  ? Hemoglobin 13.3 12.0 - 15.0 g/dL  ? HCT 38.4 36.0 - 46.0 %  ? MCV 92.5 80.0 - 100.0 fL  ? MCH 32.0 26.0 - 34.0 pg  ? MCHC 34.6 30.0 - 36.0 g/dL  ? RDW 11.9 11.5 - 15.5 %  ? Platelets 266 150 - 400 K/uL  ? nRBC 0.0 0.0 - 0.2 %  ?hCG, quantitative, pregnancy     Status: Abnormal  ? Collection Time: 04/07/21  1:35 PM  ?Result Value Ref Range  ? hCG, Beta Chain, Quant, S 2,602 (H) <5 mIU/mL  ?ABO/Rh     Status: None  ? Collection Time: 04/07/21  1:35 PM  ?Result Value Ref Range  ? ABO/RH(D) O POS   ? No rh immune globuloin    ?  NOT A RH IMMUNE GLOBULIN CANDIDATE, PT RH POSITIVE ?Performed at [facility], [place] [address] [place], [place] [address] ?  ?Comprehensive metabolic panel     Status: Abnormal  ? Collection Time: 04/07/21  1:35 PM  ?Result Value Ref Range  ? Sodium 136 135 - 145 mmol/L  ? Potassium 3.7 3.5 - 5.1 mmol/L  ? Chloride 107 98 - 111 mmol/L  ? CO2 21 (L) 22 - 32 mmol/L  ? Glucose, Bld 90 70 - 99 mg/dL  ? BUN 11 6 - 20 mg/dL  ? Creatinine, Ser 0.58 0.44 - 1.00 mg/dL  ? Calcium 8.7 (L) 8.9 - 10.3 mg/dL  ? Total Protein 6.8 6.5 - 8.1 g/dL  ? Albumin 3.6 3.5 - 5.0 g/dL  ? AST 19 15 - 41 U/L  ? ALT 18 0 - 44 U/L  ? Alkaline Phosphatase 59 38 - 126 U/L  ? Total Bilirubin 0.4 0.3 - 1.2 mg/dL  ? GFR, Estimated >60 >60 mL/min  ? Anion gap 8 5 - 15  ?Wet prep, genital     Status: Abnormal  ? Collection Time: 04/07/21  3:00 PM  ? Specimen: Vaginal  ?Result Value Ref Range  ? Yeast Wet Prep HPF POC  NONE SEEN NONE SEEN  ? Trich, Wet Prep NONE SEEN NONE SEEN  ? Clue Cells Wet Prep HPF POC NONE SEEN NONE SEEN  ? WBC, Wet Prep HPF POC >=10 (A) <10  ? Sperm NONE SEEN   ? ?US OB LESS THAN 14 WEEKS WITH OB TRANSVAGINAL ? ?Result Date: 04/07/2021 ?CLINICAL DATA:  31 year old pregnant female with  vaginal bleeding. Estimated gestational age of [redacted] weeks 3 days by LMP. EXAM: OBSTETRIC <14 WK [place] AND TRANSVAGINAL OB US TECHNIQUE: Both transabdominal and transvaginal ultrasound examinations were performed for complete evaluation of the gestation as well as the maternal uterus, adnexal regions, and pelvic cul-de-sac. Transvaginal technique was performed to assess early pregnancy. COMPARISON:  None. FINDINGS: Intrauterine gestational sac: Single Yolk sac:  Not Visualized. Embryo:  Not Visualized. MSD: 23.8 mm   7 w   2 d Subchorionic hemorrhage:  None visualized. Maternal uterus/adnexae: The ovaries bilaterally are unremarkable. No adnexal mass or free fluid. IMPRESSION: Single intrauterine gestational sac (MSD 23.8 mm) but no embryo identified. Given MSD size, this is suspicious but not yet definitive for nonviable pregnancy. Clinical/ultrasound follow-up recommended. Electronically Signed   By: Margarette Canada M.D.   On: 04/07/2021 15:55   ? ?MDM ?Cultures: Wet Prep and GC/CT ?Labs: UA, UPT, CBC, hCG, ABO ?Ultrasound ?Consult ?Assessment and Plan  ?31 year old ?[id number] at 12.3 weeks ?Vaginal Bleeding ?Likely MAB ?Language Barrier ? ?-Results return as above. ?-Provider to bedside to discuss ?-Informed that findings are highly suggestive of MAB, however further evaluation needed. ?-Addressed questions regarding results and likely MAB. ?-Discussed need for follow-up in 48 hours for repeat hCG. ?-Patient appropriately upset and allowed time to process. ?-Interpretations completed with assistance of in-person interpreter: Raquel Oritz. ? ?Maryann Conners ?04/07/2021, 4:36 PM  ? ?Reassessment (5:17 PM) ? ?-Consult with Dr. Kathlen Mody who recommends: ?*No repeat hCG necessary. ?*Plan for repeat US in 5-7 days. ?-Patient reports she is ready to discuss and updated on modified POC and is agreeable. ?-[place] order placed. ?-Bleeding and return precautions reiterated.  ?-Discharged to home in stable condition. ? ?Maryann Conners MSN,  CNM ?Energy manager, [facility] for [employer] ? ? ?

## 2021-04-07 NOTE — Progress Notes (Signed)
Discharge instructions reviewed with interpreter, Raquel, present. Patient and significant other verbalized understanding of all discharge instructions. ?

## 2021-04-07 NOTE — MAU Note (Addendum)
Kathleen Estrada is a 31 y.o. at Unknown here in MAU reporting: 3 days ago she started having brown spotting, thought it was old blood.yesterday it became red, no big clots but some small clots. Today bleeding continues. No pain.   Appt with adopt on mom on 3/14, has not been seen anywhere yet.  ?LMP: 12/15 ?Onset of complaint: 3days ago ?Pain score:  0/10 ?Vitals:  ? 04/07/21 1314  ?BP: 116/72  ?Pulse: 80  ?Resp: 17  ?Temp: 98.1 ?F (36.7 ?C)  ?SpO2: 100%  ?   ?FHT: ?Lab orders placed from triage:  urine preg ?

## 2021-04-08 LAB — GC/CHLAMYDIA PROBE AMP (~~LOC~~) NOT AT ARMC
Chlamydia: NEGATIVE
Comment: NEGATIVE
Comment: NORMAL
Neisseria Gonorrhea: NEGATIVE

## 2021-04-09 ENCOUNTER — Inpatient Hospital Stay (HOSPITAL_COMMUNITY)
Admission: AD | Admit: 2021-04-09 | Discharge: 2021-04-09 | Disposition: A | Discharge status: Home / Self Care | Payer: Medicaid Other | Attending: Obstetrics and Gynecology | Admitting: Obstetrics and Gynecology

## 2021-04-09 ENCOUNTER — Other Ambulatory Visit (HOSPITAL_COMMUNITY): Payer: Self-pay

## 2021-04-09 ENCOUNTER — Other Ambulatory Visit: Payer: Self-pay

## 2021-04-09 ENCOUNTER — Inpatient Hospital Stay (HOSPITAL_COMMUNITY): Payer: Self-pay

## 2021-04-09 DIAGNOSIS — O26891 Other specified pregnancy related conditions, first trimester: Secondary | ICD-10-CM | POA: Diagnosis present

## 2021-04-09 DIAGNOSIS — O200 Threatened abortion: Secondary | ICD-10-CM | POA: Insufficient documentation

## 2021-04-09 DIAGNOSIS — Z6790 Unspecified blood type, Rh positive: Secondary | ICD-10-CM | POA: Insufficient documentation

## 2021-04-09 DIAGNOSIS — Z3A12 12 weeks gestation of pregnancy: Secondary | ICD-10-CM | POA: Insufficient documentation

## 2021-04-09 LAB — HCG, QUANTITATIVE, PREGNANCY: hCG, Beta Chain, Quant, S: 2307 m[IU]/mL — ABNORMAL HIGH (ref <5)

## 2021-04-09 NOTE — MAU Provider Note (Signed)
[name]  is a 31 y.o. [id number] at [redacted]w[redacted]d by LMP who presents to MAU today for VB. Reports seeing blood when she wipes and occasionally on the pad. The patient was seen in MAU on 04/07/21 and had quant hCG of 2602 and US showed IUGS only measuring 23.74mm.  ? ?OB History  ?Gravida Para Term Preterm AB Living  ?5 2 2   2 2   ?SAB IAB Ectopic Multiple Live Births  ?2     0 2  ?  ?# Outcome Date GA Lbr Len/2nd Weight Sex Delivery Anes PTL Lv  ?5 Current           ?4 Term 10/02/19 [redacted]w[redacted]d 14:15 / 00:30 3215 g M VBAC, Vacuum EPI  LIV  ?3 Term 10/01/09    M CS-LTranv  Y LIV  ?   Birth Comments: footling breech  ?2 SAB           ?1 SAB           ?   Birth Comments: System Generated. Please review and update pregnancy details.  ? ? ?Past Medical History:  ?Diagnosis Date  ? Medical history non-contributory   ? ?ROS: ?+ VB ?no pain ? ?BP 121/66 (BP Location: Right Arm)   Temp 98.2 ?F (36.8 ?C) (Oral)   Resp 17   Ht 5\' 2"  (1.575 m)   Wt 61.8 kg   LMP 01/10/2021   SpO2 100%   BMI 24.91 kg/m?   ?CONSTITUTIONAL: Well-developed, well-nourished female in no acute distress.  ?MUSCULOSKELETAL: Normal range of motion.  ?CARDIOVASCULAR: Regular heart rate ?RESPIRATORY: Normal effort ?NEUROLOGICAL: Alert and oriented to person, place, and time.  ?SKIN: Not diaphoretic. No erythema. No pallor. ?PSYCH: Normal mood and affect. Normal behavior. Normal judgment and thought content. ? ?Results for orders placed or performed during the hospital encounter of 04/09/21 (from the past 24 hour(s))  ?hCG, quantitative, pregnancy     Status: Abnormal  ? Collection Time: 04/09/21 10:51 AM  ?Result Value Ref Range  ? hCG, Beta Chain, Quant, S 2,307 (H) <5 mIU/mL  ? ?04/11/21 OB Transvaginal ? ?Result Date: 04/09/2021 ?CLINICAL DATA:  Assess viability. Estimated gestational age of [redacted] weeks, 5 days by LMP. EXAM: TRANSVAGINAL OB ULTRASOUND TECHNIQUE: Transvaginal ultrasound was performed for complete evaluation of the gestation as well as the  maternal uterus, adnexal regions, and pelvic cul-de-sac. COMPARISON:  OB ultrasound dated April 07, 2021. FINDINGS: Intrauterine gestational sac: Single. Yolk sac:  Not Visualized. Embryo:  Not Visualized. MSD: 22 mm   7 w   0 d Subchorionic hemorrhage:  None visualized. Maternal uterus/adnexae: Unremarkable. IMPRESSION: 1. Slightly decreased intrauterine gestational sac size compared to prior study, without embryo identified. Findings remain suspicious but not yet definitive for failed pregnancy. Recommend follow-up 04/11/2021 in 10-14 days for definitive diagnosis. This recommendation follows SRU consensus guidelines: Diagnostic Criteria for Nonviable Pregnancy Early in the First Trimester. [place] Med 2013Malva Limes. Electronically Signed   By: 2014 M.D.   On: 04/09/2021 13:35   ? ?MDM: Labs ordered and reviewed. Modest drop in qhcg, consult with Dr. Obie Dredge, plan for repeat 04/11/2021. ?Para March reviewed, slightly smaller IUGS today, most likely failed pregnancy and anticipate SAB. Plan for rpt [place] next week. Discussed findings with pt and partner. ? ?A: ?1. Threatened abortion   ?2. Blood type, Rh positive   ? ? ?P: ?Discharge home ?Pelvic rest ?Patient will return for follow-up US on 04/17/21 ?Bleeding precautions ?Patient may return to  MAU as needed or if her condition were to change or worsen  ? ?Interpreter present for all interactions ? ?Donette Larry, CNM ?04/09/2021 3:09 PM  ? ?

## 2021-04-09 NOTE — MAU Note (Addendum)
...  Kathleen Estrada is a 31 y.o. at [redacted]w[redacted]d here in MAU reporting: Evaluated in MAU two days ago for vaginal bleeding. U/S demonstrated a gestational sac with a MSD of 23.8 mm with no yolk sac or embryo visualized. She states she is not experiencing any pain. She states her bleeding has not increased and has remained the same and she only sees blood on her pad occasionally but sees it every time she wipes after using the restroom. She states her bleeding is bright red, and occasionally passes "pepper flake"sized clots.  ? ?She states she was given an ultrasound for ten days from now but states this is far away and she wants to know what is going on now. ? ?Pain score: Denies pain. ? ?

## 2021-04-17 ENCOUNTER — Other Ambulatory Visit: Payer: Self-pay | Admitting: Obstetrics and Gynecology

## 2021-04-17 ENCOUNTER — Other Ambulatory Visit: Payer: Self-pay

## 2021-04-17 ENCOUNTER — Ambulatory Visit
Admission: RE | Admit: 2021-04-17 | Discharge: 2021-04-17 | Disposition: A | Discharge status: Home / Self Care | Payer: Self-pay | Source: Ambulatory Visit

## 2021-04-17 ENCOUNTER — Ambulatory Visit (INDEPENDENT_AMBULATORY_CARE_PROVIDER_SITE_OTHER): Payer: Medicaid Other | Admitting: Certified Nurse Midwife

## 2021-04-17 DIAGNOSIS — O039 Complete or unspecified spontaneous abortion without complication: Secondary | ICD-10-CM

## 2021-04-17 DIAGNOSIS — O021 Missed abortion: Secondary | ICD-10-CM | POA: Diagnosis present

## 2021-04-17 MED ORDER — IBUPROFEN 600 MG PO TABS: 600.0000 mg | ORAL_TABLET | Freq: Four times a day (QID) | ORAL | 3 refills | Status: AC | PRN

## 2021-04-17 MED ORDER — MISOPROSTOL 200 MCG PO TABS: ORAL_TABLET | ORAL | 1 refills | Status: DC

## 2021-04-17 MED ORDER — OXYCODONE HCL 5 MG PO TABS: 5.0000 mg | ORAL_TABLET | ORAL | 0 refills | Status: AC | PRN

## 2021-04-17 NOTE — Progress Notes (Signed)
Oxycodone sent for pain control secondary to cytotec management of missed abortion ?

## 2021-04-17 NOTE — Progress Notes (Signed)
History:  ?Kathleen Estrada is a 31 y.o. [phone] who presents to clinic today for follow up ultrasound in the setting of a pregnancy of unknown location being followed for the past 10 days. She has had light spotting for the past two weeks but nothing heavy, denies cramping. ? ?The following portions of the patient's history were reviewed and updated as appropriate: allergies, current medications, family history, past medical history, social history, past surgical history and problem list. ? ?Spanish interpreter present throughout visit. ?Review of Systems:  ?Pertinent items noted in HPI and remainder of comprehensive ROS otherwise negative.  ?  ?Objective:  ?Physical Exam ?LMP 01/10/2021  ?Physical Exam ?Vitals and nursing note reviewed.  ?Constitutional:   ?   General: She is not in acute distress. ?   Appearance: Normal appearance. She is normal weight. She is not ill-appearing.  ?HENT:  ?   Head: Normocephalic and atraumatic.  ?   Mouth/Throat:  ?   Mouth: Mucous membranes are moist.  ?Eyes:  ?   Pupils: Pupils are equal, round, and reactive to light.  ?Cardiovascular:  ?   Rate and Rhythm: Normal rate and regular rhythm.  ?Pulmonary:  ?   Effort: Pulmonary effort is normal.  ?Musculoskeletal:     ?   General: Normal range of motion.  ?Skin: ?   General: Skin is warm and dry.  ?   Capillary Refill: Capillary refill takes less than 2 seconds.  ?Neurological:  ?   Mental Status: She is alert and oriented to person, place, and time.  ?Psychiatric:     ?   Mood and Affect: Mood normal.     ?   Behavior: Behavior normal.     ?   Thought Content: Thought content normal.  ? ?Labs and Imaging ?No results found for this or any previous visit (from the past 24 hour(s)). ? ?US OB Transvaginal ? ?Result Date: 04/17/2021 ?CLINICAL DATA:  Abnormal exam 04/09/2021, evaluate for viability. EXAM: OBSTETRIC <14 WK [place] AND TRANSVAGINAL OB US TECHNIQUE: Both transabdominal and transvaginal ultrasound examinations were  performed for complete evaluation of the gestation as well as the maternal uterus, adnexal regions, and pelvic cul-de-sac. Transvaginal technique was performed to assess early pregnancy. COMPARISON:  04/09/2021. FINDINGS: Intrauterine gestational sac: Present, irregular. Yolk sac:  None. Embryo:  None. Cardiac Activity: None. MSD: 17.3 mm    w     d Subchorionic hemorrhage:  None visualized. Maternal uterus/adnexae: Airways are visualized.  No free fluid. IMPRESSION: Irregular empty intrauterine gestational sac continues to decrease in size. Findings are suspicious but not yet definitive for failed pregnancy. Recommend follow-up US in 10-14 days for definitive diagnosis. This recommendation follows SRU consensus guidelines: Diagnostic Criteria for Nonviable Pregnancy Early in the First Trimester. Malva Limes Med 2013; 973:5329-92. Electronically Signed   By: Leanna Battles M.D.   On: 04/17/2021 11:46   ? ?Reviewed report with Dr. Donavan Foil who agreed the images today are definitive for a failed pregnancy and advised pt be counseled as such and offered cytotec therapy. ? ?Assessment & Plan:  ?1. Miscarriage ?- Pt walked through findings and what we'd expect. Condolences given and pt informed of failed pregnancy. Pt verbalized wanting to help facilitate the miscarriage since she has already been bleeding for two weeks.  ?- Last Hgb= 13.3 ?- misoprostol (CYTOTEC) 200 MCG tablet; Place four tablets in between your gums and cheeks (two tablets on each side) as instructed  Dispense: 4 tablet; Refill: 1 ?-  ibuprofen (ADVIL) 600 MG tablet; Take 1 tablet (600 mg total) by mouth every 6 (six) hours as needed.  Dispense: 60 tablet; Refill: 3 ?- oxycodone 5mg  tablets q6hrs PRN for pain. Dispense: 6 tablet; Refill: 0 ? ?Follow up in one week for repeat quant and two weeks with provider for SAB follow up. ? ? , CNM ?04/17/2021 ?8:50 PM ? ?

## 2021-04-18 ENCOUNTER — Inpatient Hospital Stay (EMERGENCY_DEPARTMENT_HOSPITAL): Payer: Medicaid Other | Admitting: Anesthesiology

## 2021-04-18 ENCOUNTER — Inpatient Hospital Stay (HOSPITAL_COMMUNITY): Payer: Medicaid Other | Admitting: Anesthesiology

## 2021-04-18 ENCOUNTER — Encounter (HOSPITAL_COMMUNITY): Payer: Self-pay | Admitting: Obstetrics & Gynecology

## 2021-04-18 ENCOUNTER — Encounter (HOSPITAL_COMMUNITY)
Admission: AD | Disposition: A | Discharge status: Home / Self Care | Payer: Self-pay | Source: Home / Self Care | Attending: Obstetrics & Gynecology

## 2021-04-18 ENCOUNTER — Other Ambulatory Visit: Payer: Self-pay

## 2021-04-18 ENCOUNTER — Inpatient Hospital Stay (HOSPITAL_COMMUNITY)
Admission: AD | Admit: 2021-04-18 | Discharge: 2021-04-18 | Disposition: A | Discharge status: Home / Self Care | Payer: Medicaid Other | Attending: Obstetrics & Gynecology | Admitting: Obstetrics & Gynecology

## 2021-04-18 DIAGNOSIS — O021 Missed abortion: Secondary | ICD-10-CM

## 2021-04-18 DIAGNOSIS — O039 Complete or unspecified spontaneous abortion without complication: Secondary | ICD-10-CM | POA: Insufficient documentation

## 2021-04-18 HISTORY — PX: DILATION AND EVACUATION: SHX1459

## 2021-04-18 LAB — CBC
HCT: 36.1 % (ref 36.0–46.0)
Hemoglobin: 12.7 g/dL (ref 12.0–15.0)
MCH: 32.6 pg (ref 26.0–34.0)
MCHC: 35.2 g/dL (ref 30.0–36.0)
MCV: 92.8 fL (ref 80.0–100.0)
Platelets: 263 10*3/uL (ref 150–400)
RBC: 3.89 MIL/uL (ref 3.87–5.11)
RDW: 11.8 % (ref 11.5–15.5)
WBC: 12.5 10*3/uL — ABNORMAL HIGH (ref 4.0–10.5)
nRBC: 0 % (ref 0.0–0.2)

## 2021-04-18 LAB — TYPE AND SCREEN
ABO/RH(D): O POS
Antibody Screen: NEGATIVE

## 2021-04-18 SURGERY — DILATION AND EVACUATION, UTERUS
Anesthesia: General

## 2021-04-18 MED ORDER — OXYTOCIN-SODIUM CHLORIDE 30-0.9 UT/500ML-% IV SOLN
INTRAVENOUS | Status: DC | PRN
Start: 2021-04-18 — End: 2021-04-18
  Administered 2021-04-18: 30 [IU] via INTRAVENOUS

## 2021-04-18 MED ORDER — ACETAMINOPHEN 500 MG PO TABS
ORAL_TABLET | ORAL | Status: AC
  Administered 2021-04-18: 1000 mg via ORAL
  Filled 2021-04-18: qty 2

## 2021-04-18 MED ORDER — SCOPOLAMINE 1 MG/3DAYS TD PT72
MEDICATED_PATCH | TRANSDERMAL | Status: AC
  Administered 2021-04-18: 1.5 mg via TRANSDERMAL
  Filled 2021-04-18: qty 1

## 2021-04-18 MED ORDER — OXYCODONE HCL 5 MG PO TABS: 5.0000 mg | ORAL_TABLET | Freq: Once | ORAL | Status: DC | PRN

## 2021-04-18 MED ORDER — DOXYCYCLINE HYCLATE 100 MG IV SOLR
200.0000 mg | INTRAVENOUS | Status: AC
  Administered 2021-04-18: 200 mg via INTRAVENOUS
  Filled 2021-04-18: qty 100

## 2021-04-18 MED ORDER — CHLORHEXIDINE GLUCONATE 0.12 % MT SOLN: 15.0000 mL | Freq: Once | OROMUCOSAL | Status: AC

## 2021-04-18 MED ORDER — PHENYLEPHRINE 40 MCG/ML (10ML) SYRINGE FOR IV PUSH (FOR BLOOD PRESSURE SUPPORT)
PREFILLED_SYRINGE | INTRAVENOUS | Status: DC | PRN
  Administered 2021-04-18: 80 ug via INTRAVENOUS
  Administered 2021-04-18: 120 ug via INTRAVENOUS
  Administered 2021-04-18 (×3): 80 ug via INTRAVENOUS
  Administered 2021-04-18: 120 ug via INTRAVENOUS

## 2021-04-18 MED ORDER — OXYCODONE HCL 5 MG/5ML PO SOLN: 5.0000 mg | Freq: Once | ORAL | Status: DC | PRN

## 2021-04-18 MED ORDER — DEXAMETHASONE SODIUM PHOSPHATE 10 MG/ML IJ SOLN
INTRAMUSCULAR | Status: DC | PRN
  Administered 2021-04-18: 10 mg via INTRAVENOUS

## 2021-04-18 MED ORDER — FENTANYL CITRATE (PF) 100 MCG/2ML IJ SOLN: 25.0000 ug | INTRAMUSCULAR | Status: DC | PRN

## 2021-04-18 MED ORDER — LACTATED RINGERS IV BOLUS
1000.0000 mL | Freq: Once | INTRAVENOUS | Status: AC
Start: 2021-04-18 — End: 2021-04-18
  Administered 2021-04-18: 1000 mL via INTRAVENOUS

## 2021-04-18 MED ORDER — 0.9 % SODIUM CHLORIDE (POUR BTL) OPTIME
TOPICAL | Status: DC | PRN
  Administered 2021-04-18: 1000 mL

## 2021-04-18 MED ORDER — ORAL CARE MOUTH RINSE: 15.0000 mL | Freq: Once | OROMUCOSAL | Status: AC

## 2021-04-18 MED ORDER — PROPOFOL 10 MG/ML IV BOLUS
INTRAVENOUS | Status: AC
  Filled 2021-04-18: qty 20

## 2021-04-18 MED ORDER — EPHEDRINE SULFATE-NACL 50-0.9 MG/10ML-% IV SOSY
PREFILLED_SYRINGE | INTRAVENOUS | Status: DC | PRN
  Administered 2021-04-18: 5 mg via INTRAVENOUS

## 2021-04-18 MED ORDER — FENTANYL CITRATE (PF) 250 MCG/5ML IJ SOLN
INTRAMUSCULAR | Status: DC | PRN
  Administered 2021-04-18: 50 ug via INTRAVENOUS

## 2021-04-18 MED ORDER — FENTANYL CITRATE (PF) 100 MCG/2ML IJ SOLN
100.0000 ug | Freq: Once | INTRAMUSCULAR | Status: AC
  Administered 2021-04-18: 100 ug via INTRAVENOUS
  Filled 2021-04-18: qty 2

## 2021-04-18 MED ORDER — PROPOFOL 10 MG/ML IV BOLUS
INTRAVENOUS | Status: DC | PRN
  Administered 2021-04-18: 150 mg via INTRAVENOUS

## 2021-04-18 MED ORDER — FENTANYL CITRATE (PF) 250 MCG/5ML IJ SOLN
INTRAMUSCULAR | Status: AC
  Filled 2021-04-18: qty 5

## 2021-04-18 MED ORDER — AMISULPRIDE (ANTIEMETIC) 5 MG/2ML IV SOLN: 10.0000 mg | Freq: Once | INTRAVENOUS | Status: DC | PRN

## 2021-04-18 MED ORDER — ONDANSETRON HCL 4 MG/2ML IJ SOLN
INTRAMUSCULAR | Status: DC | PRN
Start: 2021-04-18 — End: 2021-04-18
  Administered 2021-04-18: 4 mg via INTRAVENOUS

## 2021-04-18 MED ORDER — LIDOCAINE 2% (20 MG/ML) 5 ML SYRINGE
INTRAMUSCULAR | Status: DC | PRN
  Administered 2021-04-18: 80 mg via INTRAVENOUS

## 2021-04-18 MED ORDER — ONDANSETRON HCL 4 MG/2ML IJ SOLN: 4.0000 mg | Freq: Once | INTRAMUSCULAR | Status: DC | PRN

## 2021-04-18 MED ORDER — LIDOCAINE 2% (20 MG/ML) 5 ML SYRINGE
INTRAMUSCULAR | Status: AC
  Filled 2021-04-18: qty 5

## 2021-04-18 MED ORDER — MIDAZOLAM HCL 2 MG/2ML IJ SOLN
INTRAMUSCULAR | Status: AC
  Filled 2021-04-18: qty 2

## 2021-04-18 MED ORDER — SCOPOLAMINE 1 MG/3DAYS TD PT72: 1.0000 | MEDICATED_PATCH | TRANSDERMAL | Status: DC

## 2021-04-18 MED ORDER — DEXAMETHASONE SODIUM PHOSPHATE 10 MG/ML IJ SOLN
INTRAMUSCULAR | Status: AC
  Filled 2021-04-18: qty 1

## 2021-04-18 MED ORDER — ACETAMINOPHEN 500 MG PO TABS: 1000.0000 mg | ORAL_TABLET | Freq: Once | ORAL | Status: AC

## 2021-04-18 MED ORDER — POVIDONE-IODINE 10 % EX SWAB
2.0000 "application " | Freq: Once | CUTANEOUS | Status: AC
  Administered 2021-04-18: 2 via TOPICAL

## 2021-04-18 MED ORDER — LACTATED RINGERS IV SOLN: INTRAVENOUS | Status: DC

## 2021-04-18 MED ORDER — CHLORHEXIDINE GLUCONATE 0.12 % MT SOLN
OROMUCOSAL | Status: AC
  Administered 2021-04-18: 15 mL via OROMUCOSAL
  Filled 2021-04-18: qty 15

## 2021-04-18 MED ORDER — SUCCINYLCHOLINE CHLORIDE 200 MG/10ML IV SOSY
PREFILLED_SYRINGE | INTRAVENOUS | Status: DC | PRN
Start: 2021-04-18 — End: 2021-04-18
  Administered 2021-04-18: 160 mg via INTRAVENOUS

## 2021-04-18 MED ORDER — ONDANSETRON HCL 4 MG/2ML IJ SOLN
INTRAMUSCULAR | Status: AC
  Filled 2021-04-18: qty 2

## 2021-04-18 MED ORDER — OXYTOCIN-SODIUM CHLORIDE 30-0.9 UT/500ML-% IV SOLN
INTRAVENOUS | Status: AC
  Filled 2021-04-18: qty 500

## 2021-04-18 MED ORDER — MIDAZOLAM HCL 5 MG/5ML IJ SOLN
INTRAMUSCULAR | Status: DC | PRN
Start: 2021-04-18 — End: 2021-04-18
  Administered 2021-04-18: 2 mg via INTRAVENOUS

## 2021-04-18 SURGICAL SUPPLY — 20 items
CATH ROBINSON RED A/P 16FR (CATHETERS) ×2 IMPLANT
DECANTER SPIKE VIAL GLASS SM (MISCELLANEOUS) IMPLANT
FILTER UTR ASPR ASSEMBLY (MISCELLANEOUS) ×2 IMPLANT
GLOVE SURG ENC MOIS LTX SZ6.5 (GLOVE) ×2 IMPLANT
GLOVE SURG UNDER POLY LF SZ7 (GLOVE) ×4 IMPLANT
GOWN STRL REUS W/ TWL LRG LVL3 (GOWN DISPOSABLE) ×2 IMPLANT
GOWN STRL REUS W/TWL LRG LVL3 (GOWN DISPOSABLE) ×2
HOSE CONNECTING 18IN BERKELEY (TUBING) ×2 IMPLANT
KIT BERKELEY 1ST TRI 3/8 NO TR (MISCELLANEOUS) ×2 IMPLANT
KIT BERKELEY 1ST TRIMESTER 3/8 (MISCELLANEOUS) ×2 IMPLANT
NS IRRIG 1000ML POUR BTL (IV SOLUTION) ×2 IMPLANT
PACK VAGINAL MINOR WOMEN LF (CUSTOM PROCEDURE TRAY) ×2 IMPLANT
PAD OB MATERNITY 4.3X12.25 (PERSONAL CARE ITEMS) ×2 IMPLANT
SET BERKELEY SUCTION TUBING (SUCTIONS) ×1 IMPLANT
TOWEL GREEN STERILE FF (TOWEL DISPOSABLE) ×4 IMPLANT
UNDERPAD 30X36 HEAVY ABSORB (UNDERPADS AND DIAPERS) ×2 IMPLANT
VACURETTE 10 RIGID CVD (CANNULA) IMPLANT
VACURETTE 7MM CVD STRL WRAP (CANNULA) IMPLANT
VACURETTE 8 RIGID CVD (CANNULA) IMPLANT
VACURETTE 9 RIGID CVD (CANNULA) ×1 IMPLANT

## 2021-04-18 NOTE — Op Note (Signed)
[facility] ?PROCEDURE DATE: 04/18/2021 ? ?PREOPERATIVE DIAGNOSIS: 7 week missed abortion ?POSTOPERATIVE DIAGNOSIS: The same ?PROCEDURE:     Dilation and Evacuation ?SURGEON:  Scheryl Darter MD ? ?INDICATIONS: 31 y.o. [id number] with MAB at [redacted] weeks gestation, needing surgical completion.  Risks of surgery were discussed with the patient including but not limited to: bleeding which may require transfusion; infection which may require antibiotics; injury to uterus or surrounding organs; need for additional procedures including laparotomy or laparoscopy; possibility of intrauterine scarring which may impair future fertility; and other postoperative/anesthesia complications. Written informed consent was obtained.   ? ?FINDINGS:  A 6 week size uterus, moderate amounts of products of conception, specimen sent to pathology. ? ?ANESTHESIA:    Monitored intravenous sedation, paracervical block. ?INTRAVENOUS FLUIDS:  500 ml of LR ?ESTIMATED BLOOD LOSS:  50 ml. ?SPECIMENS:  Products of conception sent to pathology ?COMPLICATIONS:  None immediate. ? ?PROCEDURE DETAILS:  The patient received intravenous Doxycycline.  She was then taken to the operating room where monitored intravenous sedation was administered and was found to be adequate.  After an adequate timeout was performed, she was placed in the dorsal lithotomy position and examined; then prepped and draped in the sterile manner.   Her bladder was catheterized for an unmeasured amount of clear, yellow urine. A vaginal speculum was then placed in the patient's vagina and a single tooth tenaculum was applied to the anterior lip of the cervix. The cervix was gently dilated to accommodate a 9 mm suction curette that was gently advanced to the uterine fundus.  The suction device was then activated and curette slowly rotated to clear the uterus of products of conception. There was minimal bleeding noted and the tenaculum removed with good hemostasis noted.   All instruments  were removed from the patient's vagina.  Sponge and instrument counts were correct times two  The patient tolerated the procedure well and was taken to the recovery area awake, and in stable condition.  ? ?Adam Phenix, MD ?04/18/2021 ?1:26 PM ? ?

## 2021-04-18 NOTE — MAU Provider Note (Signed)
?History  ?  ? ?[id number] ? ?Arrival date and time: 04/18/21 0825 ?  ? ?No chief complaint on file. ? ? ? ?HPI ?Thanya Voeks is a 31 y.o. at [redacted]w[redacted]d by 7wk [place] with PMHx notable for one prior CS and one prior VBAC, who presents for vaginal bleeding.  ? ?Patient has been following with MAU and office for likely SAB ?Yesterday seen in office for ultrasound result, diagnosed with non-viable pregnancy ?Elected for medical management and took misoprostol last night ?She reports she began to have heavy bleeding "like urination" ?She then had syncope and was quite by her husband three times when she tried to get up ?She feels very weak and pale ?Bleeding has not improved at all by her estimation ? ? ?--/--/PENDING (03/23 0930) ? ?OB History   ? ? Gravida  ?5  ? Para  ?2  ? Term  ?2  ? Preterm  ?   ? AB  ?2  ? Living  ?2  ?  ? ? SAB  ?2  ? IAB  ?   ? Ectopic  ?   ? Multiple  ?0  ? Live Births  ?2  ?   ?  ?  ? ? ?Past Medical History:  ?Diagnosis Date  ? Medical history non-contributory   ? ? ?Past Surgical History:  ?Procedure Laterality Date  ? CESAREAN SECTION  2011  ? ? ?Family History  ?Problem Relation Age of Onset  ? Healthy Mother   ? Healthy Father   ? ? ?Social History  ? ?Socioeconomic History  ? Marital status: Single  ?  Spouse name: Not on file  ? Number of children: Not on file  ? Years of education: Not on file  ? Highest education level: Not on file  ?Occupational History  ? Not on file  ?Tobacco Use  ? Smoking status: Never  ? Smokeless tobacco: Never  ?Vaping Use  ? Vaping Use: Never used  ?Substance and Sexual Activity  ? Alcohol use: No  ? Drug use: No  ? Sexual activity: Not Currently  ?  Birth control/protection: None  ?Other Topics Concern  ? Not on file  ?Social History Narrative  ? Not on file  ? ?Social Determinants of Health  ? ?Financial Resource Strain: Not on file  ?Food Insecurity: Not on file  ?Transportation Needs: Not on file  ?Physical Activity: Not on file  ?Stress: Not on file   ?Social Connections: Not on file  ?Intimate Partner Violence: Not on file  ? ? ?No Known Allergies ? ?No current facility-administered medications on file prior to encounter.  ? ?Current Outpatient Medications on File Prior to Encounter  ?Medication Sig Dispense Refill  ? acetaminophen (TYLENOL) 325 MG tablet Take 1 tablet (325 mg total) by mouth every 6 (six) hours as needed for mild pain or headache.    ? ibuprofen (ADVIL) 600 MG tablet Take 1 tablet (600 mg total) by mouth every 6 (six) hours as needed. 60 tablet 3  ? misoprostol (CYTOTEC) 200 MCG tablet Place four tablets in between your gums and cheeks (two tablets on each side) as instructed 4 tablet 1  ? oxyCODONE (OXY IR/ROXICODONE) 5 MG immediate release tablet Take 1 tablet (5 mg total) by mouth every 4 (four) hours as needed for severe pain or breakthrough pain. 6 tablet 0  ? Prenatal Vit-Fe Fumarate-FA (PRENATAL VITAMIN PO) Take 1 tablet by mouth daily.     ? coconut oil OIL Apply 1 application topically  as needed (nipple pain).  0  ? ? ? ?ROS ?Pertinent positives and negative per HPI, all others reviewed and negative ? ?Physical Exam  ? ?BP 105/70 (BP Location: Right Arm)   Pulse (!) 105   Temp 98.5 ?F (36.9 ?C) (Oral)   Resp 16   Ht 5\' 2"  (1.575 m)   Wt 59.7 kg   LMP 01/10/2021   SpO2 98%   BMI 24.07 kg/m?  ? ?Patient Vitals for the past 24 hrs: ? BP Temp Temp src Pulse Resp SpO2 Height Weight  ?04/18/21 0840 105/70 98.5 ?F (36.9 ?C) Oral (!) 105 16 98 % 5\' 2"  (1.575 m) 59.7 kg  ? ? ?Physical Exam ?Vitals reviewed.  ?Constitutional:   ?   General: She is not in acute distress. ?   Appearance: She is well-developed. She is not diaphoretic.  ?Eyes:  ?   General: No scleral icterus. ?Pulmonary:  ?   Effort: Pulmonary effort is normal. No respiratory distress.  ?Genitourinary: ?   Comments: Large amount of clot cleared out of vaginal vault. POC's identified, attempted to gently remove but coming out in pieces ?Skin: ?   General: Skin is warm and  dry.  ?   Coloration: Skin is pale.  ?Neurological:  ?   Mental Status: She is alert.  ?   Coordination: Coordination normal.  ?  ? ?Cervical Exam ?  ? ?Bedside Ultrasound ?Not done ? ?My interpretation: n/a ? ? ?Labs ?Results for orders placed or performed during the hospital encounter of 04/18/21 (from the past 24 hour(s))  ?Type and screen [facility]     Status: None (Preliminary result)  ? Collection Time: 04/18/21  9:30 AM  ?Result Value Ref Range  ? ABO/RH(D) PENDING   ? Antibody Screen PENDING   ? Sample Expiration    ?  04/21/2021,2359 ?Performed at [facility], [address], [place], [place] [address] ?  ?CBC     Status: Abnormal  ? Collection Time: 04/18/21  9:34 AM  ?Result Value Ref Range  ? WBC 12.5 (H) 4.0 - 10.5 K/uL  ? RBC 3.89 3.87 - 5.11 MIL/uL  ? Hemoglobin 12.7 12.0 - 15.0 g/dL  ? HCT 36.1 36.0 - 46.0 %  ? MCV 92.8 80.0 - 100.0 fL  ? MCH 32.6 26.0 - 34.0 pg  ? MCHC 35.2 30.0 - 36.0 g/dL  ? RDW 11.8 11.5 - 15.5 %  ? Platelets 263 150 - 400 K/uL  ? nRBC 0.0 0.0 - 0.2 %  ? ? ?Imaging ?US OB Transvaginal ? ?Result Date: 04/17/2021 ?CLINICAL DATA:  Abnormal exam 04/09/2021, evaluate for viability. EXAM: OBSTETRIC <14 WK [place] AND TRANSVAGINAL OB US TECHNIQUE: Both transabdominal and transvaginal ultrasound examinations were performed for complete evaluation of the gestation as well as the maternal uterus, adnexal regions, and pelvic cul-de-sac. Transvaginal technique was performed to assess early pregnancy. COMPARISON:  04/09/2021. FINDINGS: Intrauterine gestational sac: Present, irregular. Yolk sac:  None. Embryo:  None. Cardiac Activity: None. MSD: 17.3 mm    w     d Subchorionic hemorrhage:  None visualized. Maternal uterus/adnexae: Airways are visualized.  No free fluid. IMPRESSION: Irregular empty intrauterine gestational sac continues to decrease in size. Findings are suspicious but not yet definitive for failed pregnancy. Recommend follow-up US in 10-14 days for  definitive diagnosis. This recommendation follows SRU consensus guidelines: Diagnostic Criteria for Nonviable Pregnancy Early in the First Trimester. Malva Limes Med 2013; 188:4166-06. Electronically Signed  By: Lorin Picket M.D.   On: 04/17/2021 11:46   ? ?MAU Course  ?Procedures ?Lab Orders    ?     CBC    ?Meds ordered this encounter  ?Medications  ? lactated ringers bolus 1,000 mL  ? fentaNYL (SUBLIMAZE) injection 100 mcg  ? ?Imaging Orders  ?No imaging studies ordered today  ? ? ?MDM ?High ? ?Assessment and Plan  ?#SAB ?Patient pale appearing but hemodynamically stable. GU exam showed large amount of clot in vaginal vault and POC's extruding. Attempted to tease them out with ring forceps but was coming out in pieces and patient very uncomfortable. Will get patient an IV and pain medicines, suction to bedside, and attempt again to see if we can help remainder of POC's evacuate without going to the OR. Also notified second attending Dr. Roselie Awkward who will come down to assist.  ? ?9:31 AM ? ?Patient medicated with fentanyl and manual extraction again attempted with better setup but neither myself or Dr. Roselie Awkward were able to fully remove POC's. Decision made to proceed to the OR for D&C. Patient consented by myself in Spanish at bedside. Discussed risks of infection, bleeding with possible need for transfusion, blood clots, uterine perforation with possible need for further operations to repair. Patient understood risks and benefits, all questions answered.  ? ?Dispo: to OR for D&C.  ? ? ? ?Clarnce Flock, MD/MPH ?04/18/21 ?10:04 AM ? ? ? ?

## 2021-04-18 NOTE — MAU Note (Addendum)
Clothing (coat, shirt leggings, shoes)  with pt in belongings bag. ?Purse, jewelry (2 prs of white metal colored studs and beaded bracelet- in plastic cup w/lid) with husband in belonging bag. ?IV to saline lock for transport to short stay.  900cc infused.  ?

## 2021-04-18 NOTE — H&P (Signed)
? ? ?OB/GYN History and Physical ? ?Kathleen Estrada is a 31 y.o. [id number] here for surgical management of SAB.   ? ?Patient has been following with MAU and office for likely SAB ?Yesterday seen in office for ultrasound result, diagnosed with non-viable pregnancy ?Elected for medical management and took misoprostol last night ?She reports she began to have heavy bleeding "like urination" ?She then had syncope and was quite by her husband three times when she tried to get up ?She feels very weak and pale ?Bleeding has not improved at all by her estimation ? ?Past Medical History:  ?Diagnosis Date  ? Medical history non-contributory   ? ?Past Surgical History:  ?Procedure Laterality Date  ? CESAREAN SECTION  2011  ? ?OB History  ?Gravida Para Term Preterm AB Living  ?5 2 2   2 2   ?SAB IAB Ectopic Multiple Live Births  ?2     0 2  ?  ?# Outcome Date GA Lbr Len/2nd Weight Sex Delivery Anes PTL Lv  ?5 Current           ?4 Term 10/02/19 [id number] 14:15 / 00:30 3215 g M VBAC, Vacuum EPI  LIV  ?3 Term 10/01/09    M CS-LTranv  Y LIV  ?   Birth Comments: footling breech  ?2 SAB           ?1 SAB           ?   Birth Comments: System Generated. Please review and update pregnancy details.  ?Patient denies any other pertinent gynecologic issues.  ? ?No current facility-administered medications on file prior to encounter.  ? ?Current Outpatient Medications on File Prior to Encounter  ?Medication Sig Dispense Refill  ? acetaminophen (TYLENOL) 325 MG tablet Take 1 tablet (325 mg total) by mouth every 6 (six) hours as needed for mild pain or headache.    ? ibuprofen (ADVIL) 600 MG tablet Take 1 tablet (600 mg total) by mouth every 6 (six) hours as needed. 60 tablet 3  ? misoprostol (CYTOTEC) 200 MCG tablet Place four tablets in between your gums and cheeks (two tablets on each side) as instructed 4 tablet 1  ? oxyCODONE (OXY IR/ROXICODONE) 5 MG immediate release tablet Take 1 tablet (5 mg total) by mouth every 4 (four) hours as needed  for severe pain or breakthrough pain. 6 tablet 0  ? Prenatal Vit-Fe Fumarate-FA (PRENATAL VITAMIN PO) Take 1 tablet by mouth daily.     ? coconut oil OIL Apply 1 application topically as needed (nipple pain).  0  ? ?No Known Allergies ? ?Social History:   reports that she has never smoked. She has never used smokeless tobacco. She reports that she does not drink alcohol and does not use drugs. ? ?Family History  ?Problem Relation Age of Onset  ? Healthy Mother   ? Healthy Father   ? ? ?Review of Systems: Pertinent items noted in HPI and remainder of comprehensive ROS otherwise negative. ? ?PHYSICAL EXAM: ?Blood pressure 120/67, pulse 84, temperature 98.5 ?F (36.9 ?C), temperature source Oral, resp. rate 16, height 5\' 2"  (1.575 m), weight 59.7 kg, last menstrual period 01/10/2021, SpO2 99 %, unknown if currently breastfeeding. ?Physical Exam ?Vitals reviewed.  ?Constitutional:   ?   General: She is not in acute distress. ?   Appearance: She is well-developed. She is not diaphoretic.  ?Eyes:  ?   General: No scleral icterus. ?Pulmonary:  ?   Effort: Pulmonary effort is normal. No respiratory  distress.  ?Genitourinary: ?   Comments: Large amount of clot cleared out of vaginal vault. POC's identified, attempted to gently remove but coming out in pieces ?Skin: ?   General: Skin is warm and dry.  ?   Coloration: Skin is pale.  ?Neurological:  ?   Mental Status: She is alert.  ?   Coordination: Coordination normal.  ? ?Labs: ?Results for orders placed or performed during the hospital encounter of 04/18/21 (from the past 336 hour(s))  ?Type and screen [facility]  ? Collection Time: 04/18/21  9:30 AM  ?Result Value Ref Range  ? ABO/RH(D) PENDING   ? Antibody Screen PENDING   ? Sample Expiration    ?  04/21/2021,2359 ?Performed at [facility], [address] [place], [place] [address] ?  ?CBC  ? Collection Time: 04/18/21  9:34 AM  ?Result Value Ref Range  ? WBC 12.5 (H) 4.0 - 10.5 K/uL  ? RBC 3.89  3.87 - 5.11 MIL/uL  ? Hemoglobin 12.7 12.0 - 15.0 g/dL  ? HCT 36.1 36.0 - 46.0 %  ? MCV 92.8 80.0 - 100.0 fL  ? MCH 32.6 26.0 - 34.0 pg  ? MCHC 35.2 30.0 - 36.0 g/dL  ? RDW 11.8 11.5 - 15.5 %  ? Platelets 263 150 - 400 K/uL  ? nRBC 0.0 0.0 - 0.2 %  ?Results for orders placed or performed during the hospital encounter of 04/09/21 (from the past 336 hour(s))  ?hCG, quantitative, pregnancy  ? Collection Time: 04/09/21 10:51 AM  ?Result Value Ref Range  ? hCG, Beta Chain, Quant, S 2,307 (H) <5 mIU/mL  ?Results for orders placed or performed during the hospital encounter of 04/07/21 (from the past 336 hour(s))  ?Pregnancy, urine POC  ? Collection Time: 04/07/21  1:20 PM  ?Result Value Ref Range  ? Preg Test, Ur POSITIVE (A) NEGATIVE  ?Urinalysis, Routine w reflex microscopic Urine, Clean Catch  ? Collection Time: 04/07/21  1:27 PM  ?Result Value Ref Range  ? Color, Urine YELLOW YELLOW  ? APPearance CLEAR CLEAR  ? Specific Gravity, Urine 1.017 1.005 - 1.030  ? pH 7.0 5.0 - 8.0  ? Glucose, UA NEGATIVE NEGATIVE mg/dL  ? Hgb urine dipstick LARGE (A) NEGATIVE  ? Bilirubin Urine NEGATIVE NEGATIVE  ? Ketones, ur NEGATIVE NEGATIVE mg/dL  ? Protein, ur NEGATIVE NEGATIVE mg/dL  ? Nitrite NEGATIVE NEGATIVE  ? Leukocytes,Ua TRACE (A) NEGATIVE  ? RBC / HPF 21-50 0 - 5 RBC/hpf  ? WBC, UA 0-5 0 - 5 WBC/hpf  ? Bacteria, UA NONE SEEN NONE SEEN  ? Squamous Epithelial / LPF 0-5 0 - 5  ? Mucus PRESENT   ?CBC  ? Collection Time: 04/07/21  1:35 PM  ?Result Value Ref Range  ? WBC 7.1 4.0 - 10.5 K/uL  ? RBC 4.15 3.87 - 5.11 MIL/uL  ? Hemoglobin 13.3 12.0 - 15.0 g/dL  ? HCT 38.4 36.0 - 46.0 %  ? MCV 92.5 80.0 - 100.0 fL  ? MCH 32.0 26.0 - 34.0 pg  ? MCHC 34.6 30.0 - 36.0 g/dL  ? RDW 11.9 11.5 - 15.5 %  ? Platelets 266 150 - 400 K/uL  ? nRBC 0.0 0.0 - 0.2 %  ?hCG, quantitative, pregnancy  ? Collection Time: 04/07/21  1:35 PM  ?Result Value Ref Range  ? hCG, Beta Chain, Quant, S 2,602 (H) <5 mIU/mL  ?Comprehensive metabolic panel  ? Collection  Time: 04/07/21  1:35 PM  ?Result Value  Ref Range  ? Sodium 136 135 - 145 mmol/L  ? Potassium 3.7 3.5 - 5.1 mmol/L  ? Chloride 107 98 - 111 mmol/L  ? CO2 21 (L) 22 - 32 mmol/L  ? Glucose, Bld 90 70 - 99 mg/dL  ? BUN 11 6 - 20 mg/dL  ? Creatinine, Ser 0.58 0.44 - 1.00 mg/dL  ? Calcium 8.7 (L) 8.9 - 10.3 mg/dL  ? Total Protein 6.8 6.5 - 8.1 g/dL  ? Albumin 3.6 3.5 - 5.0 g/dL  ? AST 19 15 - 41 U/L  ? ALT 18 0 - 44 U/L  ? Alkaline Phosphatase 59 38 - 126 U/L  ? Total Bilirubin 0.4 0.3 - 1.2 mg/dL  ? GFR, Estimated >60 >60 mL/min  ? Anion gap 8 5 - 15  ?ABO/Rh  ? Collection Time: 04/07/21  1:35 PM  ?Result Value Ref Range  ? ABO/RH(D) O POS   ? No rh immune globuloin    ?  NOT A RH IMMUNE GLOBULIN CANDIDATE, PT RH POSITIVE ?Performed at [facility], [address], [place], [place] [address] ?  ?GC/Chlamydia probe amp (Nina)not at [facility]  ? Collection Time: 04/07/21  2:39 PM  ?Result Value Ref Range  ? Neisseria Gonorrhea Negative   ? Chlamydia Negative   ? Comment Normal Reference Ranger Chlamydia - Negative   ? Comment    ?  Normal Reference Range Neisseria Gonorrhea - Negative  ?Wet prep, genital  ? Collection Time: 04/07/21  3:00 PM  ? Specimen: Vaginal  ?Result Value Ref Range  ? Yeast Wet Prep HPF POC NONE SEEN NONE SEEN  ? Trich, Wet Prep NONE SEEN NONE SEEN  ? Clue Cells Wet Prep HPF POC NONE SEEN NONE SEEN  ? WBC, Wet Prep HPF POC >=10 (A) <10  ? Sperm NONE SEEN   ? ? ?Imaging Studies: ?US OB Transvaginal ? ?Result Date: 04/17/2021 ?CLINICAL DATA:  Abnormal exam 04/09/2021, evaluate for viability. EXAM: OBSTETRIC <14 WK [place] AND TRANSVAGINAL OB US TECHNIQUE: Both transabdominal and transvaginal ultrasound examinations were performed for complete evaluation of the gestation as well as the maternal uterus, adnexal regions, and pelvic cul-de-sac. Transvaginal technique was performed to assess early pregnancy. COMPARISON:  04/09/2021. FINDINGS: Intrauterine gestational sac: Present, irregular. Yolk sac:   None. Embryo:  None. Cardiac Activity: None. MSD: 17.3 mm    w     d Subchorionic hemorrhage:  None visualized. Maternal uterus/adnexae: Airways are visualized.  No free fluid. IMPRESSION: Irregular empty in

## 2021-04-18 NOTE — Transfer of Care (Signed)
Immediate Anesthesia Transfer of Care Note ? ?Patient: Kathleen Estrada ? ?Procedure(s) Performed: SUCTION DILATATION AND EVACUATION ? ?Patient Location: PACU ? ?Anesthesia Type:General ? ?Level of Consciousness: awake, oriented and patient cooperative ? ?Airway & Oxygen Therapy: Patient Spontanous Breathing and Patient connected to nasal cannula oxygen ? ?Post-op Assessment: Report given to RN and Post -op Vital signs reviewed and stable ? ?Post vital signs: Reviewed ? ?Last Vitals:  ?Vitals Value Taken Time  ?BP 120/74 04/18/21 1252  ?Temp 36.4 ?C 04/18/21 1252  ?Pulse 72 04/18/21 1305  ?Resp 16 04/18/21 1305  ?SpO2 100 % 04/18/21 1305  ?Vitals shown include unvalidated device data. ? ?Last Pain:  ?Vitals:  ? 04/18/21 1252  ?TempSrc:   ?PainSc: 0-No pain  ?   ? ?Patients Stated Pain Goal: 2 (04/18/21 1128) ? ?Complications: No notable events documented. ?

## 2021-04-18 NOTE — Anesthesia Procedure Notes (Signed)
Procedure Name: Intubation ?Date/Time: 04/18/2021 12:11 PM ?Performed by: Jenne Campus, CRNA ?Pre-anesthesia Checklist: Patient identified, Emergency Drugs available, Suction available and Patient being monitored ?Patient Re-evaluated:Patient Re-evaluated prior to induction ?Oxygen Delivery Method: Circle System Utilized ?Preoxygenation: Pre-oxygenation with 100% oxygen ?Induction Type: IV induction ?Ventilation: Mask ventilation without difficulty ?Laryngoscope Size: Sabra Heck and 2 ?Grade View: Grade I ?Tube type: Oral ?Tube size: 7.0 mm ?Number of attempts: 1 ?Airway Equipment and Method: Stylet and Oral airway ?Placement Confirmation: ETT inserted through vocal cords under direct vision, positive ETCO2 and breath sounds checked- equal and bilateral ?Secured at: 21 cm ?Tube secured with: Tape ?Dental Injury: Teeth and Oropharynx as per pre-operative assessment  ? ? ? ? ?

## 2021-04-18 NOTE — Anesthesia Postprocedure Evaluation (Signed)
Anesthesia Post Note ? ?Patient: Kathleen Estrada ? ?Procedure(s) Performed: SUCTION DILATATION AND EVACUATION ? ?  ? ?Patient location during evaluation: PACU ?Anesthesia Type: General ?Level of consciousness: awake ?Pain management: pain level controlled ?Vital Signs Assessment: post-procedure vital signs reviewed and stable ?Respiratory status: spontaneous breathing and respiratory function stable ?Cardiovascular status: stable ?Postop Assessment: no apparent nausea or vomiting ?Anesthetic complications: no ? ? ?No notable events documented. ? ?Last Vitals:  ?Vitals:  ? 04/18/21 1307 04/18/21 1322  ?BP: 116/67 112/73  ?Pulse: 79 69  ?Resp: 14 14  ?Temp:  (!) 36.3 ?C  ?SpO2: 100% 100%  ?  ?Last Pain:  ?Vitals:  ? 04/18/21 1322  ?TempSrc:   ?PainSc: 0-No pain  ? ? ?  ?  ?  ?  ?  ?  ? ?Merlinda Frederick ? ? ? ? ?

## 2021-04-18 NOTE — MAU Note (Addendum)
Kathleen Estrada is a 31 y.o. at [redacted]w[redacted]d here in MAU reporting: seen yesterday, dx with missed AB,  took the Cytotec at 1900.  Having heavy bleeding, passed out 2 or 3 times last night.  Blood was coming out like water. Took pain medication, helps.  Feeling very weak. ?Onset of complaint: last night ?Pain score: 2 ?Vitals:  ? 04/18/21 0840  ?BP: 105/70  ?Pulse: (!) 105  ?Resp: 16  ?Temp: 98.5 ?F (36.9 ?C)  ?SpO2: 98%  ?   ? ?Lab orders placed from triage:  none ?

## 2021-04-18 NOTE — Anesthesia Preprocedure Evaluation (Addendum)
Anesthesia Evaluation  ?Patient identified by MRN, date of birth, ID band ?Patient awake ? ? ? ?Reviewed: ?Allergy & Precautions, NPO status , Patient's Chart, lab work & pertinent test results ? ?Airway ?Mallampati: II ? ?TM Distance: >3 FB ?Neck ROM: Full ? ? ? Dental ?no notable dental hx. ? ?  ?Pulmonary ?neg pulmonary ROS,  ?  ?Pulmonary exam normal ?breath sounds clear to auscultation ? ? ? ? ? ? Cardiovascular ?Exercise Tolerance: Good ?negative cardio ROS ?Normal cardiovascular exam ?Rhythm:Regular Rate:Normal ? ? ?  ?Neuro/Psych ?negative neurological ROS ? negative psych ROS  ? GI/Hepatic ?negative GI ROS, Neg liver ROS,   ?Endo/Other  ?negative endocrine ROS ? Renal/GU ?negative Renal ROS  ?negative genitourinary ?  ?Musculoskeletal ?negative musculoskeletal ROS ?(+)  ? Abdominal ?  ?Peds ?negative pediatric ROS ?(+)  Hematology ?negative hematology ROS ?(+)   ?Anesthesia Other Findings ? ? Reproductive/Obstetrics ?SAB ? ?  ? ? ? ? ? ? ? ? ? ? ? ? ? ?  ?  ? ? ? ? ? ? ? ?Anesthesia Physical ?Anesthesia Plan ? ?ASA: 2 and emergent ? ?Anesthesia Plan: General  ? ?Post-op Pain Management: Tylenol PO (pre-op)*  ? ?Induction: Intravenous, Rapid sequence and Cricoid pressure planned ? ?PONV Risk Score and Plan: Scopolamine patch - Pre-op, Treatment may vary due to age or medical condition, Midazolam, Ondansetron and Dexamethasone ? ?Airway Management Planned: Oral ETT ? ?Additional Equipment: None ? ?Intra-op Plan:  ? ?Post-operative Plan: Extubation in OR ? ?Informed Consent: I have reviewed the patients History and Physical, chart, labs and discussed the procedure including the risks, benefits and alternatives for the proposed anesthesia with the patient or authorized representative who has indicated his/her understanding and acceptance.  ? ? ? ?Interpreter used for interveiw ? ?Plan Discussed with: CRNA, Anesthesiologist and Surgeon ? ?Anesthesia Plan Comments: (Last ate at  0600, so not appropriately NPO. Surgeon has deemed case emergent and wishes to proceed. I discussed with the patient the risk of aspiration given the timing of her last meal. She expressed understanding. Interpreter used for interview. Tanna Furry, MD  ?)  ? ? ? ? ? ?Anesthesia Quick Evaluation ? ?

## 2021-04-18 NOTE — MAU Note (Signed)
Spoke with Bonnye Fava, Nurse, learning disability.  Explained what has been done, plan.  Lab results(CBC stable). VSS. No pain currently since medicated.   Asking to see if pt has any questions.  Plan is for OR ~1400 due to breakfast. Office to call chaplain(declined). ?

## 2021-04-19 ENCOUNTER — Encounter (HOSPITAL_COMMUNITY): Payer: Self-pay | Admitting: Obstetrics & Gynecology

## 2021-04-19 LAB — SURGICAL PATHOLOGY

## 2021-04-24 ENCOUNTER — Other Ambulatory Visit: Payer: Self-pay

## 2021-04-24 ENCOUNTER — Encounter: Payer: Self-pay | Admitting: Family Medicine

## 2021-04-24 ENCOUNTER — Ambulatory Visit (INDEPENDENT_AMBULATORY_CARE_PROVIDER_SITE_OTHER): Payer: Medicaid Other | Admitting: Family Medicine

## 2021-04-24 VITALS — BP 110/76 | HR 99 | Wt 132.0 lb

## 2021-04-24 DIAGNOSIS — O039 Complete or unspecified spontaneous abortion without complication: Secondary | ICD-10-CM

## 2021-04-24 NOTE — Progress Notes (Signed)
? ?  Subjective:  ? ? Patient ID: Kathleen Estrada, female    DOB: 15-Dec-1990, 31 y.o.   MRN: [id number] ? ?HPI ?Patient is 1 week post op from D&E for missed AB. She and her family has had the stomach virus for the past week. She feels weak and tired, but otherwise okay. Minimal bleeding, no abdominal pain. No other complaints. ? ? ?Review of Systems ? ?   ?Objective:  ? Physical Exam ?Vitals reviewed.  ?Constitutional:   ?   Appearance: Normal appearance.  ?Abdominal:  ?   General: Abdomen is flat. There is no distension.  ?   Palpations: Abdomen is soft.  ?   Tenderness: There is no abdominal tenderness. There is no guarding.  ?Skin: ?   Capillary Refill: Capillary refill takes less than 2 seconds.  ?Neurological:  ?   General: No focal deficit present.  ?   Mental Status: She is alert.  ?Psychiatric:     ?   Mood and Affect: Mood normal.     ?   Behavior: Behavior normal.     ?   Thought Content: Thought content normal.     ?   Judgment: Judgment normal.  ? ? ?   ?Assessment & Plan:  ?1. SAB (spontaneous abortion) ?Does not want to become pregnant in the near future. Would like IUD, but no insurance. Will send to [facility]. ? ?

## 2021-04-24 NOTE — Progress Notes (Signed)
? ?  Subjective:  ? ? Patient ID: Kathleen Estrada, female    DOB: [birth year], 31 y.o.   MRN: [id number] ? ?HPI ? ? ? ?Review of Systems ? ?   ?Objective:  ? Physical Exam ? ? ? ? ?   ?Assessment & Plan:  ? ?

## 2021-07-15 NOTE — Addendum Note (Signed)
Addendum  created 07/15/21 1158 by Adair Laundry, CRNA   Intraprocedure Meds edited
